# Patient Record
Sex: Female | Born: 1960 | Race: Black or African American | Hispanic: No | Marital: Single | Smoking: Current every day smoker
Health system: Southern US, Community
[De-identification: ages and names within clinical notes are randomized; demographics above are authoritative.]

## PROBLEM LIST (undated history)

## (undated) DIAGNOSIS — Z972 Presence of dental prosthetic device (complete) (partial): Secondary | ICD-10-CM

## (undated) DIAGNOSIS — I10 Essential (primary) hypertension: Secondary | ICD-10-CM

## (undated) DIAGNOSIS — T8859XA Other complications of anesthesia, initial encounter: Secondary | ICD-10-CM

## (undated) HISTORY — PX: ABDOMINAL HYSTERECTOMY: SHX81

## (undated) HISTORY — PX: OTHER SURGICAL HISTORY: SHX169

## (undated) HISTORY — PX: BREAST CYST ASPIRATION: SHX578

## (undated) HISTORY — DX: Essential (primary) hypertension: I10

---

## 2004-10-04 ENCOUNTER — Ambulatory Visit: Payer: Self-pay

## 2006-06-25 ENCOUNTER — Ambulatory Visit: Payer: Self-pay

## 2007-05-15 ENCOUNTER — Other Ambulatory Visit: Payer: Self-pay

## 2007-05-16 ENCOUNTER — Inpatient Hospital Stay: Payer: Self-pay | Admitting: Internal Medicine

## 2008-05-12 ENCOUNTER — Ambulatory Visit: Payer: Self-pay | Admitting: Internal Medicine

## 2008-05-12 ENCOUNTER — Ambulatory Visit: Payer: Self-pay | Admitting: Family Medicine

## 2009-03-07 ENCOUNTER — Ambulatory Visit: Payer: Self-pay | Admitting: Family Medicine

## 2011-12-03 ENCOUNTER — Emergency Department: Payer: Self-pay | Admitting: *Deleted

## 2011-12-11 ENCOUNTER — Emergency Department: Payer: Self-pay | Admitting: Internal Medicine

## 2013-10-11 ENCOUNTER — Emergency Department: Payer: Self-pay | Admitting: Emergency Medicine

## 2018-02-27 ENCOUNTER — Encounter: Payer: Self-pay | Admitting: Family Medicine

## 2018-02-27 ENCOUNTER — Ambulatory Visit: Payer: BLUE CROSS/BLUE SHIELD | Admitting: Family Medicine

## 2018-02-27 VITALS — BP 142/82 | HR 60 | Ht 65.0 in | Wt 135.0 lb

## 2018-02-27 DIAGNOSIS — Z7689 Persons encountering health services in other specified circumstances: Secondary | ICD-10-CM

## 2018-02-27 DIAGNOSIS — F17200 Nicotine dependence, unspecified, uncomplicated: Secondary | ICD-10-CM | POA: Diagnosis not present

## 2018-02-27 DIAGNOSIS — Z23 Encounter for immunization: Secondary | ICD-10-CM | POA: Diagnosis not present

## 2018-02-27 DIAGNOSIS — R002 Palpitations: Secondary | ICD-10-CM

## 2018-02-27 DIAGNOSIS — I10 Essential (primary) hypertension: Secondary | ICD-10-CM | POA: Diagnosis not present

## 2018-02-27 MED ORDER — LISINOPRIL 10 MG PO TABS: 10.0000 mg | ORAL_TABLET | Freq: Every day | ORAL | 2 refills | Status: DC

## 2018-02-27 MED ORDER — METOPROLOL SUCCINATE ER 25 MG PO TB24: 25.0000 mg | ORAL_TABLET | Freq: Every day | ORAL | 2 refills | Status: DC

## 2018-02-27 MED ORDER — LISINOPRIL 20 MG PO TABS: 20.0000 mg | ORAL_TABLET | Freq: Every day | ORAL | 1 refills | Status: DC

## 2018-02-27 NOTE — Progress Notes (Signed)
Date:  02/27/2018   Name:  Jamie Cervantes   DOB:  1960/10/14   MRN:  [id number]   Chief Complaint: Establish Care (PHQ=4); Hypertension (been back on med x 1 year- needs pcp to continue med); and Flu Vaccine Patient is a 57 year old female who presents for a comprehensive physical exam. The patient reports the following problems polyuria. Health maintenance has been reviewed needs everything.  Hypertension  This is a chronic problem. The current episode started more than 1 year ago. The problem is unchanged. The problem is controlled. Associated symptoms include palpitations. Pertinent negatives include no anxiety, blurred vision, chest pain, headaches, malaise/fatigue, neck pain, orthopnea, peripheral edema, PND, shortness of breath or sweats. There are no associated agents to hypertension. There are no known risk factors for coronary artery disease. Past treatments include ACE inhibitors. The current treatment provides moderate improvement. Compliance problems include medication cost.  There is no history of angina, kidney disease, CAD/MI, CVA, heart failure, left ventricular hypertrophy, PVD or retinopathy. There is no history of chronic renal disease, a hypertension causing med or renovascular disease.  Palpitations   This is a new problem. The current episode started 1 to 4 weeks ago (about a month). The problem occurs daily (usually at night). The symptoms are aggravated by caffeine (smoking). Associated symptoms include numbness. Pertinent negatives include no anxiety, chest pain, coughing, dizziness, fever, malaise/fatigue, nausea, shortness of breath, vomiting or weakness. The treatment provided moderate relief.     Review of Systems  Constitutional: Negative.  Negative for chills, fatigue, fever, malaise/fatigue and unexpected weight change.  HENT: Negative for congestion, ear discharge, ear pain, rhinorrhea, sinus pressure, sneezing and sore throat.   Eyes: Negative for blurred  vision, photophobia, pain, discharge, redness and itching.  Respiratory: Negative for cough, shortness of breath, wheezing and stridor.   Cardiovascular: Positive for palpitations. Negative for chest pain, orthopnea, leg swelling and PND.  Gastrointestinal: Negative for abdominal pain, blood in stool, constipation, diarrhea, nausea and vomiting.  Endocrine: Negative for cold intolerance, heat intolerance, polydipsia, polyphagia and polyuria.  Genitourinary: Negative for dysuria, flank pain, frequency, hematuria, menstrual problem, pelvic pain, urgency, vaginal bleeding and vaginal discharge.  Musculoskeletal: Negative for arthralgias, back pain, myalgias and neck pain.  Skin: Negative for rash.  Allergic/Immunologic: Negative for environmental allergies and food allergies.  Neurological: Positive for numbness. Negative for dizziness, weakness, light-headedness and headaches.  Hematological: Negative for adenopathy. Does not bruise/bleed easily.  Psychiatric/Behavioral: Negative for dysphoric mood. The patient is not nervous/anxious.     There are no active problems to display for this patient.   Allergies  Allergen Reactions  . Penicillins Nausea Only    Past Surgical History:  Procedure Laterality Date  . ABDOMINAL HYSTERECTOMY     partial  . toe spurs      Social History   Tobacco Use  . Smoking status: Current Every Day Smoker    Types: Cigarettes  . Smokeless tobacco: Never Used  . Tobacco comment: discussed smoking cessation- pt refuses/ has own plan  Substance Use Topics  . Alcohol use: Yes    Comment: occasionally  . Drug use: Never     Medication list has been reviewed and updated.  Current Meds  Medication Sig  . lisinopril (PRINIVIL,ZESTRIL) 10 MG tablet Take 1 tablet by mouth daily.    PHQ 2/9 Scores 02/27/2018  PHQ - 2 Score 1  PHQ- 9 Score 4    Physical Exam  Constitutional: She is oriented to  person, place, and time. She appears well-developed and  well-nourished.  HENT:  Head: Normocephalic.  Right Ear: External ear normal.  Left Ear: External ear normal.  Mouth/Throat: Oropharynx is clear and moist.  Eyes: Pupils are equal, round, and reactive to light. Conjunctivae and EOM are normal. Lids are everted and swept, no foreign bodies found. Left eye exhibits no hordeolum. No foreign body present in the left eye. Right conjunctiva is not injected. Left conjunctiva is not injected. No scleral icterus.  Neck: Normal range of motion. Neck supple. No JVD present. No tracheal deviation present. No thyromegaly present.  Cardiovascular: Normal rate, regular rhythm, normal heart sounds and intact distal pulses. Exam reveals no gallop and no friction rub.  No murmur heard. Pulmonary/Chest: Effort normal and breath sounds normal. No respiratory distress. She has no wheezes. She has no rales.  Abdominal: Soft. Bowel sounds are normal. She exhibits no mass. There is no hepatosplenomegaly. There is no tenderness. There is no rebound and no guarding.  Musculoskeletal: Normal range of motion. She exhibits no edema or tenderness.  Lymphadenopathy:    She has no cervical adenopathy.  Neurological: She is alert and oriented to person, place, and time. She has normal strength. She displays normal reflexes. No cranial nerve deficit.  Skin: Skin is warm. No rash noted.  Psychiatric: She has a normal mood and affect. Her mood appears not anxious. She does not exhibit a depressed mood.  Nursing note and vitals reviewed.   BP (!) 142/82   Pulse 60   Ht 5\' 5"  (1.651 m)   Wt 135 lb (61.2 kg)   BMI 22.47 kg/m   1. Establishing care with new doctor, encounter for Patient establishes care with new physician.  Will obtain baseline renal function panel. - Renal Function Panel  2. Essential hypertension Chronic.  Uncontrolled.  Will increase lisinopril to 20 mg.  We will recheck in 6 weeks. - lisinopril (PRINIVIL,ZESTRIL) 20 MG tablet; Take 1 tablet (20 mg  total) by mouth daily.  Dispense: 30 tablet; Refill: 1  3. Tobacco dependence Patient has been advised of the health risks of smoking and counseled concerning cessation of tobacco products. I spent over 3 minutes for discussion and to answer questions.  4. Influenza vaccine needed Discussed and administered - Flu Vaccine QUAD 36+ mos IM  5. Palpitations Patient relates episodes of palpitations over the past month without chest discomfort.  Patient relates previous history of an EKG that may have shown a previous MI.  Will obtain EKG with reading below - EKG 12-Lead - lisinopril (PRINIVIL,ZESTRIL) 20 MG tablet; Take 1 tablet (20 mg total) by mouth daily.  Dispense: 30 tablet; Refill: 1 I have reviewed EKG which shows ST and T wave abnormalities consider anterior lateral ischemia patient has sinus bradycardia 50 we will try to retrieve previous EKG to compare.. Comparison to previous EKG dated 20 years ago not available.  Dr. [facility]  02/27/2018

## 2018-02-28 LAB — RENAL FUNCTION PANEL
Albumin: 4.9 g/dL (ref 3.5–5.5)
BUN/Creatinine Ratio: 15 (ref 9–23)
BUN: 16 mg/dL (ref 6–24)
CO2: 27 mmol/L (ref 20–29)
Calcium: 10.3 mg/dL — ABNORMAL HIGH (ref 8.7–10.2)
Chloride: 102 mmol/L (ref 96–106)
Creatinine, Ser: 1.04 mg/dL — ABNORMAL HIGH (ref 0.57–1.00)
GFR calc Af Amer: 68 mL/min/1.73
GFR calc non Af Amer: 59 mL/min/1.73 — ABNORMAL LOW
Glucose: 87 mg/dL (ref 65–99)
Phosphorus: 3.3 mg/dL (ref 2.5–4.5)
Potassium: 4.3 mmol/L (ref 3.5–5.2)
Sodium: 142 mmol/L (ref 134–144)

## 2018-03-02 NOTE — Addendum Note (Signed)
Addended by: Fredderick Severance on: 03/02/2018 07:50 AM   Modules accepted: Orders

## 2018-03-03 DIAGNOSIS — E782 Mixed hyperlipidemia: Secondary | ICD-10-CM | POA: Diagnosis not present

## 2018-03-03 DIAGNOSIS — I10 Essential (primary) hypertension: Secondary | ICD-10-CM | POA: Diagnosis not present

## 2018-03-03 DIAGNOSIS — R9431 Abnormal electrocardiogram [ECG] [EKG]: Secondary | ICD-10-CM | POA: Diagnosis not present

## 2018-03-03 DIAGNOSIS — R002 Palpitations: Secondary | ICD-10-CM | POA: Diagnosis not present

## 2018-03-13 ENCOUNTER — Encounter: Payer: Self-pay | Admitting: Family Medicine

## 2018-03-13 ENCOUNTER — Ambulatory Visit: Payer: BLUE CROSS/BLUE SHIELD | Admitting: Family Medicine

## 2018-03-13 VITALS — BP 110/64 | HR 72 | Ht 65.0 in | Wt 138.0 lb

## 2018-03-13 DIAGNOSIS — I10 Essential (primary) hypertension: Secondary | ICD-10-CM | POA: Diagnosis not present

## 2018-03-13 DIAGNOSIS — R002 Palpitations: Secondary | ICD-10-CM

## 2018-03-13 MED ORDER — LISINOPRIL 20 MG PO TABS: 20.0000 mg | ORAL_TABLET | Freq: Every day | ORAL | 1 refills | Status: DC

## 2018-03-13 NOTE — Progress Notes (Signed)
Date:  03/13/2018   Name:  Jamie Cervantes   DOB:  Sep 25, 1960   MRN:  [id number]   Chief Complaint: Follow-up (saw Dr Nehemiah Massed- plans to have stress test and carotid doppler and AAA scan) Hypertension  This is a chronic problem. The current episode started more than 1 year ago. The problem has been gradually improving since onset. The problem is controlled. Pertinent negatives include no anxiety, blurred vision, chest pain, headaches, malaise/fatigue, neck pain, orthopnea, palpitations, peripheral edema, PND, shortness of breath or sweats. There are no associated agents to hypertension. There are no known risk factors for coronary artery disease. Past treatments include ACE inhibitors. The current treatment provides moderate improvement. There is no history of angina, kidney disease, CAD/MI, CVA, heart failure, left ventricular hypertrophy, PVD or retinopathy. There is no history of chronic renal disease, a hypertension causing med or renovascular disease.     Review of Systems  Constitutional: Negative.  Negative for chills, fatigue, fever, malaise/fatigue and unexpected weight change.  HENT: Negative for congestion, ear discharge, ear pain, rhinorrhea, sinus pressure, sneezing and sore throat.   Eyes: Negative for blurred vision, photophobia, pain, discharge, redness and itching.  Respiratory: Negative for cough, shortness of breath, wheezing and stridor.   Cardiovascular: Negative for chest pain, palpitations, orthopnea and PND.  Gastrointestinal: Negative for abdominal pain, blood in stool, constipation, diarrhea, nausea and vomiting.  Endocrine: Negative for cold intolerance, heat intolerance, polydipsia, polyphagia and polyuria.  Genitourinary: Negative for dysuria, flank pain, frequency, hematuria, menstrual problem, pelvic pain, urgency, vaginal bleeding and vaginal discharge.  Musculoskeletal: Negative for arthralgias, back pain, myalgias and neck pain.  Skin: Negative for rash.    Allergic/Immunologic: Negative for environmental allergies and food allergies.  Neurological: Negative for dizziness, weakness, light-headedness, numbness and headaches.  Hematological: Negative for adenopathy. Does not bruise/bleed easily.  Psychiatric/Behavioral: Negative for dysphoric mood. The patient is not nervous/anxious.     There are no active problems to display for this patient.   Allergies  Allergen Reactions  . Penicillins Nausea Only    Past Surgical History:  Procedure Laterality Date  . ABDOMINAL HYSTERECTOMY     partial  . toe spurs      Social History   Tobacco Use  . Smoking status: Current Every Day Smoker    Types: Cigarettes  . Smokeless tobacco: Never Used  . Tobacco comment: discussed smoking cessation- pt refuses/ has own plan  Substance Use Topics  . Alcohol use: Yes    Comment: occasionally  . Drug use: Never     Medication list has been reviewed and updated.  Current Meds  Medication Sig  . lisinopril (PRINIVIL,ZESTRIL) 20 MG tablet Take 1 tablet (20 mg total) by mouth daily.  . [DISCONTINUED] lisinopril (PRINIVIL,ZESTRIL) 20 MG tablet Take 1 tablet (20 mg total) by mouth daily.    PHQ 2/9 Scores 02/27/2018  PHQ - 2 Score 1  PHQ- 9 Score 4    Physical Exam  Constitutional: She is oriented to person, place, and time. She appears well-developed and well-nourished.  HENT:  Head: Normocephalic.  Right Ear: External ear normal.  Left Ear: External ear normal.  Mouth/Throat: Oropharynx is clear and moist.  Eyes: Pupils are equal, round, and reactive to light. Conjunctivae and EOM are normal. Lids are everted and swept, no foreign bodies found. Left eye exhibits no hordeolum. No foreign body present in the left eye. Right conjunctiva is not injected. Left conjunctiva is not injected. No scleral icterus.  Neck:  Normal range of motion. Neck supple. No JVD present. No tracheal deviation present. No thyromegaly present.  Cardiovascular:  Normal rate, regular rhythm, normal heart sounds and intact distal pulses. Exam reveals no gallop and no friction rub.  No murmur heard. Pulmonary/Chest: Effort normal and breath sounds normal. No respiratory distress. She has no wheezes. She has no rales.  Abdominal: Soft. Bowel sounds are normal. She exhibits no mass. There is no hepatosplenomegaly. There is no tenderness. There is no rebound and no guarding.  Musculoskeletal: Normal range of motion. She exhibits no edema or tenderness.  Lymphadenopathy:    She has no cervical adenopathy.  Neurological: She is alert and oriented to person, place, and time. She has normal strength. She displays normal reflexes. No cranial nerve deficit.  Skin: Skin is warm. No rash noted.  Psychiatric: She has a normal mood and affect. Her mood appears not anxious. She does not exhibit a depressed mood.  Nursing note and vitals reviewed.   BP 110/64   Pulse 72   Ht 5\' 5"  (1.651 m)   Wt 138 lb (62.6 kg)   BMI 22.96 kg/m   Assessment and Plan:  1. Essential hypertension Stable on med- refill lisinopril - lisinopril (PRINIVIL,ZESTRIL) 20 MG tablet; Take 1 tablet (20 mg total) by mouth daily.  Dispense: 90 tablet; Refill: 1  2. Palpitations Proceed with stress test and carotid US as discussed with Dr Nehemiah Massed   Dr. Otilio Miu [facility]  03/13/2018

## 2018-03-18 ENCOUNTER — Other Ambulatory Visit (HOSPITAL_COMMUNITY)
Admission: RE | Admit: 2018-03-18 | Discharge: 2018-03-18 | Disposition: A | Discharge status: Home / Self Care | Payer: BLUE CROSS/BLUE SHIELD | Source: Ambulatory Visit | Attending: Family Medicine | Admitting: Family Medicine

## 2018-03-18 ENCOUNTER — Ambulatory Visit (INDEPENDENT_AMBULATORY_CARE_PROVIDER_SITE_OTHER): Payer: BLUE CROSS/BLUE SHIELD | Admitting: Family Medicine

## 2018-03-18 ENCOUNTER — Encounter: Payer: Self-pay | Admitting: Family Medicine

## 2018-03-18 VITALS — BP 138/78 | HR 70 | Resp 16 | Ht 65.0 in | Wt 137.8 lb

## 2018-03-18 DIAGNOSIS — Z01419 Encounter for gynecological examination (general) (routine) without abnormal findings: Secondary | ICD-10-CM | POA: Diagnosis not present

## 2018-03-18 DIAGNOSIS — Z1211 Encounter for screening for malignant neoplasm of colon: Secondary | ICD-10-CM

## 2018-03-18 DIAGNOSIS — Z1272 Encounter for screening for malignant neoplasm of vagina: Secondary | ICD-10-CM | POA: Diagnosis not present

## 2018-03-18 DIAGNOSIS — Z0000 Encounter for general adult medical examination without abnormal findings: Secondary | ICD-10-CM | POA: Diagnosis not present

## 2018-03-18 DIAGNOSIS — Z1231 Encounter for screening mammogram for malignant neoplasm of breast: Secondary | ICD-10-CM

## 2018-03-18 LAB — HEMOCCULT GUIAC POC 1CARD (OFFICE): Fecal Occult Blood, POC: NEGATIVE

## 2018-03-18 LAB — HM PAP SMEAR: HM Pap smear: NEGATIVE

## 2018-03-18 LAB — RESULTS CONSOLE HPV: CHL HPV: NEGATIVE

## 2018-03-18 NOTE — Progress Notes (Signed)
Date:  03/18/2018   Name:  Jamie Cervantes   DOB:  Sep 27, 1960   MRN:  [id number]   Chief Complaint: Annual Exam Patient is a 57 year old female who presents for a comprehensive physical exam. The patient reports the following problems: none. Health maintenance has been reviewed up to date.    Review of Systems  Constitutional: Negative.  Negative for chills, fatigue, fever and unexpected weight change.  HENT: Negative for congestion, ear discharge, ear pain, rhinorrhea, sinus pressure, sneezing and sore throat.   Eyes: Negative for photophobia, pain, discharge, redness and itching.  Respiratory: Negative for cough, shortness of breath, wheezing and stridor.   Gastrointestinal: Negative for abdominal pain, blood in stool, constipation, diarrhea, nausea and vomiting.  Endocrine: Negative for cold intolerance, heat intolerance, polydipsia, polyphagia and polyuria.  Genitourinary: Negative for dysuria, flank pain, frequency, hematuria, menstrual problem, pelvic pain, urgency, vaginal bleeding and vaginal discharge.  Musculoskeletal: Negative for arthralgias, back pain and myalgias.  Skin: Negative for rash.  Allergic/Immunologic: Negative for environmental allergies and food allergies.  Neurological: Negative for dizziness, weakness, light-headedness, numbness and headaches.  Hematological: Negative for adenopathy. Does not bruise/bleed easily.  Psychiatric/Behavioral: Negative for dysphoric mood. The patient is not nervous/anxious.     There are no active problems to display for this patient.   Allergies  Allergen Reactions  . Penicillins Nausea Only    Past Surgical History:  Procedure Laterality Date  . ABDOMINAL HYSTERECTOMY     partial  . toe spurs      Social History   Tobacco Use  . Smoking status: Current Every Day Smoker    Types: Cigarettes  . Smokeless tobacco: Never Used  . Tobacco comment: discussed smoking cessation- pt refuses/ has own plan  Substance Use  Topics  . Alcohol use: Yes    Comment: occasionally  . Drug use: Never     Medication list has been reviewed and updated.  Current Meds  Medication Sig  . lisinopril (PRINIVIL,ZESTRIL) 20 MG tablet Take 1 tablet (20 mg total) by mouth daily.    PHQ 2/9 Scores 02/27/2018  PHQ - 2 Score 1  PHQ- 9 Score 4    Physical Exam  Constitutional: She is oriented to person, place, and time. Vital signs are normal. She appears well-developed and well-nourished.  HENT:  Head: Normocephalic.  Right Ear: Hearing, tympanic membrane, external ear and ear canal normal.  Left Ear: Hearing, tympanic membrane, external ear and ear canal normal.  Nose: Nose normal.  Mouth/Throat: Uvula is midline and oropharynx is clear and moist. No oropharyngeal exudate, posterior oropharyngeal edema or posterior oropharyngeal erythema.  Eyes: Pupils are equal, round, and reactive to light. Conjunctivae, EOM and lids are normal. Lids are everted and swept, no foreign bodies found. Left eye exhibits no hordeolum. No foreign body present in the left eye. Right conjunctiva is not injected. Left conjunctiva is not injected. No scleral icterus.  Fundoscopic exam:      The right eye shows no arteriolar narrowing and no AV nicking.       The left eye shows no arteriolar narrowing and no AV nicking.  Neck: Trachea normal, normal range of motion and full passive range of motion without pain. Neck supple. Normal carotid pulses, no hepatojugular reflux and no JVD present. No tracheal tenderness present. Carotid bruit is not present. No neck rigidity. No tracheal deviation and no edema present. No thyroid mass and no thyromegaly present.  Cardiovascular: Normal rate, regular rhythm, S1  normal, S2 normal, normal heart sounds, intact distal pulses and normal pulses. PMI is not displaced. Exam reveals no gallop, no S3, no S4, no distant heart sounds and no friction rub.  No murmur heard.  No systolic murmur is present.  No diastolic  murmur is present. Pulses:      Carotid pulses are 2+ on the right side, and 2+ on the left side.      Radial pulses are 2+ on the right side, and 2+ on the left side.       Femoral pulses are 2+ on the right side, and 2+ on the left side.      Popliteal pulses are 2+ on the right side, and 2+ on the left side.       Dorsalis pedis pulses are 2+ on the right side, and 2+ on the left side.       Posterior tibial pulses are 2+ on the right side, and 2+ on the left side.  Pulmonary/Chest: Effort normal and breath sounds normal. No stridor. No respiratory distress. She has no decreased breath sounds. She has no wheezes. She has no rales. She exhibits no mass. Right breast exhibits no inverted nipple, no mass, no nipple discharge, no skin change and no tenderness. Left breast exhibits no inverted nipple, no mass, no nipple discharge, no skin change and no tenderness. No breast swelling, tenderness or discharge.  Skin tag noted    Abdominal: Soft. Normal appearance and bowel sounds are normal. She exhibits no mass. There is no hepatosplenomegaly. There is no tenderness. There is no rigidity, no rebound, no guarding and no CVA tenderness.  Genitourinary: Rectum normal and vagina normal. Rectal exam shows no external hemorrhoid, no internal hemorrhoid, no fissure, no mass and guaiac negative stool. No breast swelling, tenderness or discharge. Pelvic exam was performed with patient supine. There is no rash, tenderness or lesion on the right labia. There is no rash, tenderness or lesion on the left labia. Right adnexum displays no mass, no tenderness and no fullness. Left adnexum displays no mass, no tenderness and no fullness. No erythema in the vagina. No vaginal discharge found.  Genitourinary Comments: hysterectomy  Musculoskeletal: Normal range of motion. She exhibits no edema or tenderness.       Cervical back: Normal.       Thoracic back: Normal.       Lumbar back: Normal.  Lymphadenopathy:        Head (right side): No submandibular adenopathy present.       Head (left side): No submandibular adenopathy present.    She has no cervical adenopathy.       Right cervical: No superficial cervical adenopathy present.      Left cervical: No superficial cervical adenopathy present.    She has no axillary adenopathy.  Neurological: She is alert and oriented to person, place, and time. She has normal strength. She displays normal reflexes. No cranial nerve deficit or sensory deficit.  Skin: Skin is warm, dry and intact. Capillary refill takes less than 2 seconds. No rash noted. No pallor.  Psychiatric: She has a normal mood and affect. Her mood appears not anxious. She does not exhibit a depressed mood.  Nursing note and vitals reviewed.   BP 138/78   Pulse 70   Resp 16   Ht 5\' 5"  (1.651 m)   Wt 137 lb 12.8 oz (62.5 kg)   SpO2 99%   BMI 22.93 kg/m   Assessment and Plan: 1. Annual physical  exam No subjective objective concerns noted on physical exam. - Cytology - PAP ROBBYE DEDE is a 57 y.o. female who presents today for her Complete Annual Exam. She feels well. She reports exercising . She reports she is sleeping well. Immunizations are reviewed and recommendations provided.   Age appropriate screening tests are discussed. Counseling given for risk factor reduction interventions. 2. Periodic health assessment, Pap and pelvic Pap and pelvic performed and no subjective or objective concerns noted  3. Visit for screening mammogram Discussed breast cancer screening and screening mammogram schedule. - MM 3D SCREEN BREAST BILATERAL; Future  4. Screening for vaginal cancer No abnormal discharge noted on exam Of vaginal cuff. - Cytology - PAP  5. Colon cancer screening Discussed and referral for colonoscopy needed. - POCT Occult Blood Stool There are no diagnoses linked to this encounter. mammo sched for Dec 4 @ 10:40 in [place]  Dr. [facility]  03/18/2018

## 2018-03-23 LAB — CYTOLOGY - PAP
ADEQUACY: ABSENT
DIAGNOSIS: NEGATIVE
HPV: NOT DETECTED

## 2018-03-24 ENCOUNTER — Other Ambulatory Visit: Payer: Self-pay

## 2018-03-24 DIAGNOSIS — A5901 Trichomonal vulvovaginitis: Secondary | ICD-10-CM

## 2018-03-24 DIAGNOSIS — Z8679 Personal history of other diseases of the circulatory system: Secondary | ICD-10-CM | POA: Diagnosis not present

## 2018-03-24 DIAGNOSIS — I700 Atherosclerosis of aorta: Secondary | ICD-10-CM | POA: Diagnosis not present

## 2018-03-24 DIAGNOSIS — R9431 Abnormal electrocardiogram [ECG] [EKG]: Secondary | ICD-10-CM | POA: Diagnosis not present

## 2018-03-24 MED ORDER — METRONIDAZOLE 500 MG PO TABS: 2000.0000 mg | ORAL_TABLET | Freq: Every day | ORAL | 0 refills | Status: DC

## 2018-03-25 ENCOUNTER — Encounter (INDEPENDENT_AMBULATORY_CARE_PROVIDER_SITE_OTHER): Payer: Self-pay

## 2018-03-25 ENCOUNTER — Ambulatory Visit
Admission: RE | Admit: 2018-03-25 | Discharge: 2018-03-25 | Disposition: A | Discharge status: Home / Self Care | Payer: BLUE CROSS/BLUE SHIELD | Source: Ambulatory Visit | Attending: Family Medicine | Admitting: Family Medicine

## 2018-03-25 DIAGNOSIS — Z1231 Encounter for screening mammogram for malignant neoplasm of breast: Secondary | ICD-10-CM | POA: Insufficient documentation

## 2018-04-08 DIAGNOSIS — E782 Mixed hyperlipidemia: Secondary | ICD-10-CM | POA: Diagnosis not present

## 2018-04-08 DIAGNOSIS — I10 Essential (primary) hypertension: Secondary | ICD-10-CM | POA: Diagnosis not present

## 2018-04-08 DIAGNOSIS — R002 Palpitations: Secondary | ICD-10-CM | POA: Diagnosis not present

## 2018-04-08 DIAGNOSIS — I700 Atherosclerosis of aorta: Secondary | ICD-10-CM | POA: Diagnosis not present

## 2018-05-20 ENCOUNTER — Encounter: Payer: Self-pay | Admitting: Family Medicine

## 2018-05-20 ENCOUNTER — Ambulatory Visit (INDEPENDENT_AMBULATORY_CARE_PROVIDER_SITE_OTHER): Payer: BLUE CROSS/BLUE SHIELD | Admitting: Family Medicine

## 2018-05-20 VITALS — BP 124/90 | HR 64 | Temp 98.3°F | Ht 65.0 in | Wt 138.0 lb

## 2018-05-20 DIAGNOSIS — J0100 Acute maxillary sinusitis, unspecified: Secondary | ICD-10-CM

## 2018-05-20 DIAGNOSIS — F17210 Nicotine dependence, cigarettes, uncomplicated: Secondary | ICD-10-CM | POA: Diagnosis not present

## 2018-05-20 MED ORDER — AZITHROMYCIN 250 MG PO TABS: ORAL_TABLET | ORAL | 0 refills | Status: DC

## 2018-05-20 NOTE — Progress Notes (Signed)
Date:  05/20/2018   Name:  Jamie Cervantes   DOB:  10-15-1960   MRN:  [id number]   Chief Complaint: Sinusitis (started 5 days ago with headache, chills, fever, body aches, hoarse- in the bed over the weekend. Has went to work, but feeling very bad. Throat drainage and congestion)  Sinusitis  This is a new problem. The current episode started in the past 7 days (thursday). The problem has been waxing and waning since onset. There has been no fever. The fever has been present for 3 to 4 days. The pain is moderate. Associated symptoms include headaches. Pertinent negatives include no chills, congestion, coughing, diaphoresis, ear pain, hoarse voice, neck pain, shortness of breath, sinus pressure, sneezing, sore throat or swollen glands.    Review of Systems  Constitutional: Negative.  Negative for chills, diaphoresis, fatigue, fever and unexpected weight change.  HENT: Negative for congestion, ear discharge, ear pain, hoarse voice, rhinorrhea, sinus pressure, sneezing and sore throat.   Eyes: Negative for photophobia, pain, discharge, redness and itching.  Respiratory: Negative for cough, shortness of breath, wheezing and stridor.   Gastrointestinal: Negative for abdominal pain, blood in stool, constipation, diarrhea, nausea and vomiting.  Endocrine: Negative for cold intolerance, heat intolerance, polydipsia, polyphagia and polyuria.  Genitourinary: Negative for dysuria, flank pain, frequency, hematuria, menstrual problem, pelvic pain, urgency, vaginal bleeding and vaginal discharge.  Musculoskeletal: Negative for arthralgias, back pain, myalgias and neck pain.  Skin: Negative for rash.  Allergic/Immunologic: Negative for environmental allergies and food allergies.  Neurological: Positive for headaches. Negative for dizziness, weakness, light-headedness and numbness.  Hematological: Negative for adenopathy. Does not bruise/bleed easily.  Psychiatric/Behavioral: Negative for dysphoric mood.  The patient is not nervous/anxious.     There are no active problems to display for this patient.   Allergies  Allergen Reactions  . Penicillins Nausea Only    Past Surgical History:  Procedure Laterality Date  . ABDOMINAL HYSTERECTOMY     partial  . BREAST CYST ASPIRATION Right    neg  . toe spurs      Social History   Tobacco Use  . Smoking status: Current Every Day Smoker    Types: Cigarettes  . Smokeless tobacco: Never Used  . Tobacco comment: discussed smoking cessation- pt refuses/ has own plan  Substance Use Topics  . Alcohol use: Yes    Comment: occasionally  . Drug use: Never     Medication list has been reviewed and updated.  Current Meds  Medication Sig  . lisinopril (PRINIVIL,ZESTRIL) 20 MG tablet Take 1 tablet (20 mg total) by mouth daily.    PHQ 2/9 Scores 02/27/2018  PHQ - 2 Score 1  PHQ- 9 Score 4    Physical Exam Vitals signs and nursing note reviewed.  Constitutional:      General: She is not in acute distress.    Appearance: She is not diaphoretic.  HENT:     Head: Normocephalic and atraumatic.     Right Ear: Tympanic membrane, ear canal and external ear normal.     Left Ear: Tympanic membrane, ear canal and external ear normal.     Nose: Nose normal. No congestion or rhinorrhea.  Eyes:     General:        Right eye: No discharge.        Left eye: No discharge.     Conjunctiva/sclera: Conjunctivae normal.     Pupils: Pupils are equal, round, and reactive to light.  Neck:  Musculoskeletal: Normal range of motion and neck supple.     Thyroid: No thyromegaly.     Vascular: No JVD.  Cardiovascular:     Rate and Rhythm: Normal rate and regular rhythm.     Heart sounds: Normal heart sounds. No murmur. No friction rub. No gallop.   Pulmonary:     Effort: Pulmonary effort is normal.     Breath sounds: Normal breath sounds. No wheezing, rhonchi or rales.  Chest:     Chest wall: No tenderness.  Abdominal:     General: Bowel sounds  are normal.     Palpations: Abdomen is soft. There is no mass.     Tenderness: There is no abdominal tenderness. There is no guarding.  Musculoskeletal: Normal range of motion.  Lymphadenopathy:     Cervical: No cervical adenopathy.  Skin:    General: Skin is warm and dry.  Neurological:     Mental Status: She is alert.     Deep Tendon Reflexes: Reflexes are normal and symmetric.     BP 124/90   Pulse 64   Temp 98.3 F (36.8 C) (Oral)   Ht 5\' 5"  (1.651 m)   Wt 138 lb (62.6 kg)   BMI 22.96 kg/m   Assessment and Plan:  1. Acute non-recurrent maxillary sinusitis Patient with symptoms possible influenza.  Influenza was noted to be negative for a and B.  Exam is consistent with tenderness over the maxillary sinus and will treat with a azithromycin 2 today and 1 a day for 4 days thereafter. - azithromycin (ZITHROMAX) 250 MG tablet; 2 today then 1 a day for 4 days  Dispense: 6 each; Refill: 0  2. Cigarette nicotine dependence without complication Patient has been advised of the health risks of smoking and counseled concerning cessation of tobacco products. I spent over 3 minutes for discussion and to answer questions.

## 2018-06-18 ENCOUNTER — Encounter: Payer: Self-pay | Admitting: Family Medicine

## 2018-06-18 ENCOUNTER — Ambulatory Visit: Payer: BLUE CROSS/BLUE SHIELD | Admitting: Family Medicine

## 2018-06-18 VITALS — BP 120/80 | HR 80 | Temp 98.5°F | Ht 65.0 in | Wt 141.0 lb

## 2018-06-18 DIAGNOSIS — R05 Cough: Secondary | ICD-10-CM | POA: Diagnosis not present

## 2018-06-18 DIAGNOSIS — J0100 Acute maxillary sinusitis, unspecified: Secondary | ICD-10-CM | POA: Diagnosis not present

## 2018-06-18 DIAGNOSIS — I10 Essential (primary) hypertension: Secondary | ICD-10-CM

## 2018-06-18 DIAGNOSIS — T464X5A Adverse effect of angiotensin-converting-enzyme inhibitors, initial encounter: Secondary | ICD-10-CM

## 2018-06-18 DIAGNOSIS — R058 Other specified cough: Secondary | ICD-10-CM

## 2018-06-18 MED ORDER — AZITHROMYCIN 250 MG PO TABS: ORAL_TABLET | ORAL | 0 refills | Status: DC

## 2018-06-18 MED ORDER — LOSARTAN POTASSIUM 50 MG PO TABS: 50.0000 mg | ORAL_TABLET | Freq: Every day | ORAL | 1 refills | Status: DC

## 2018-06-18 NOTE — Progress Notes (Signed)
Date:  06/18/2018   Name:  Jamie Cervantes   DOB:  Sep 23, 1960   MRN:  [id number]   Chief Complaint: Sinusitis (head pressure, nausea, fatigue x 2 days)  Sinusitis  This is a new problem. The current episode started in the past 7 days. The problem has been gradually worsening since onset. There has been no fever. The pain is mild. Associated symptoms include congestion and sinus pressure. Pertinent negatives include no chills, coughing, diaphoresis, ear pain, headaches, hoarse voice, neck pain, shortness of breath, sneezing, sore throat or swollen glands. Past treatments include acetaminophen. The treatment provided mild relief.    Review of Systems  Constitutional: Negative.  Negative for chills, diaphoresis, fatigue, fever and unexpected weight change.  HENT: Positive for congestion and sinus pressure. Negative for ear discharge, ear pain, hoarse voice, rhinorrhea, sneezing and sore throat.   Eyes: Negative for photophobia, pain, discharge, redness and itching.  Respiratory: Negative for cough, shortness of breath, wheezing and stridor.   Cardiovascular: Negative for leg swelling.  Gastrointestinal: Negative for abdominal pain, blood in stool, constipation, diarrhea, nausea and vomiting.  Endocrine: Negative for cold intolerance, heat intolerance, polydipsia, polyphagia and polyuria.  Genitourinary: Negative for dysuria, flank pain, frequency, hematuria, menstrual problem, pelvic pain, urgency, vaginal bleeding and vaginal discharge.  Musculoskeletal: Negative for arthralgias, back pain, myalgias and neck pain.  Skin: Negative for rash.  Allergic/Immunologic: Negative for environmental allergies and food allergies.  Neurological: Negative for dizziness, weakness, light-headedness, numbness and headaches.  Hematological: Negative for adenopathy. Does not bruise/bleed easily.  Psychiatric/Behavioral: Negative for dysphoric mood. The patient is not nervous/anxious.     There are no active  problems to display for this patient.   Allergies  Allergen Reactions  . Penicillins Nausea Only    Past Surgical History:  Procedure Laterality Date  . ABDOMINAL HYSTERECTOMY     partial  . BREAST CYST ASPIRATION Right    neg  . toe spurs      Social History   Tobacco Use  . Smoking status: Current Every Day Smoker    Types: Cigarettes  . Smokeless tobacco: Never Used  . Tobacco comment: discussed smoking cessation- pt refuses/ has own plan  Substance Use Topics  . Alcohol use: Yes    Comment: occasionally  . Drug use: Never     Medication list has been reviewed and updated.  Current Meds  Medication Sig  . lisinopril (PRINIVIL,ZESTRIL) 20 MG tablet Take 1 tablet (20 mg total) by mouth daily.    PHQ 2/9 Scores 02/27/2018  PHQ - 2 Score 1  PHQ- 9 Score 4    Physical Exam Vitals signs and nursing note reviewed.  Constitutional:      General: She is not in acute distress.    Appearance: She is not diaphoretic.  HENT:     Head: Normocephalic and atraumatic.     Right Ear: External ear normal.     Left Ear: External ear normal.     Nose: Nose normal.  Eyes:     General:        Right eye: No discharge.        Left eye: No discharge.     Conjunctiva/sclera: Conjunctivae normal.     Pupils: Pupils are equal, round, and reactive to light.  Neck:     Musculoskeletal: Normal range of motion and neck supple.     Thyroid: No thyromegaly.     Vascular: No JVD.  Cardiovascular:     Rate  and Rhythm: Normal rate and regular rhythm.     Heart sounds: Normal heart sounds. No murmur. No friction rub. No gallop.   Pulmonary:     Effort: Pulmonary effort is normal.     Breath sounds: Normal breath sounds.  Abdominal:     General: Bowel sounds are normal.     Palpations: Abdomen is soft. There is no mass.     Tenderness: There is no abdominal tenderness. There is no guarding.  Musculoskeletal: Normal range of motion.  Lymphadenopathy:     Cervical: No cervical  adenopathy.  Skin:    General: Skin is warm and dry.  Neurological:     Mental Status: She is alert.     Deep Tendon Reflexes: Reflexes are normal and symmetric.     BP 120/80   Pulse 80   Temp 98.5 F (36.9 C) (Oral)   Ht 5\' 5"  (1.651 m)   Wt 141 lb (64 kg)   SpO2 99%   BMI 23.46 kg/m   Assessment and Plan: 1. Acute maxillary sinusitis, recurrence not specified Acute.  Persistent.  Exam is consistent with a sinus infection with tenderness over the maxillary sinuses.  Will initiate azithromycin 250 mg 2 today followed by 1 a day for 4 days. - azithromycin (ZITHROMAX) 250 MG tablet; 2 today then 1 a day for 4 days  Dispense: 6 tablet; Refill: 0  2. Cough due to ACE inhibitor And has had a persistent cough and it was noted that she is on 20 mg lisinopril switch over to Cozaar at the 50 mg dosing and will recheck in 6 weeks. - losartan (COZAAR) 50 MG tablet; Take 1 tablet (50 mg total) by mouth daily.  Dispense: 30 tablet; Refill: 1  3. Essential hypertension Has hypertension which is controlled on lisinopril however patient has a secondary cough due to the ACE inhibitor and we will switch to the ACE receptor blocker Cozaar. - losartan (COZAAR) 50 MG tablet; Take 1 tablet (50 mg total) by mouth daily.  Dispense: 30 tablet; Refill: 1

## 2018-07-30 ENCOUNTER — Ambulatory Visit: Payer: BLUE CROSS/BLUE SHIELD | Admitting: Family Medicine

## 2018-10-01 DIAGNOSIS — I700 Atherosclerosis of aorta: Secondary | ICD-10-CM | POA: Diagnosis not present

## 2018-10-01 DIAGNOSIS — E782 Mixed hyperlipidemia: Secondary | ICD-10-CM | POA: Diagnosis not present

## 2018-10-01 DIAGNOSIS — I10 Essential (primary) hypertension: Secondary | ICD-10-CM | POA: Diagnosis not present

## 2019-01-09 ENCOUNTER — Ambulatory Visit
Admission: EM | Admit: 2019-01-09 | Discharge: 2019-01-09 | Disposition: A | Discharge status: Home / Self Care | Payer: BC Managed Care – PPO

## 2019-01-09 ENCOUNTER — Other Ambulatory Visit: Payer: Self-pay

## 2019-01-09 DIAGNOSIS — J3489 Other specified disorders of nose and nasal sinuses: Secondary | ICD-10-CM | POA: Diagnosis not present

## 2019-01-09 MED ORDER — METHYLPREDNISOLONE 4 MG PO TBPK: ORAL_TABLET | ORAL | 0 refills | Status: DC

## 2019-01-09 NOTE — ED Triage Notes (Signed)
Facial pressure behind eyes more on Right side.

## 2019-01-09 NOTE — Discharge Instructions (Addendum)
I am placing you on prednisone to help with sinus inflammation. I dont detect an infection. Please watch out for a rash around the area of pain, since as intense as your pain was yesterday could be you may end up having shingles, but right now I do not detect this. If your pain gets bad again, and you have vision changes, please go to the ER.

## 2019-01-09 NOTE — ED Provider Notes (Addendum)
[facility]    CSN: [id number] Arrival date & time: 01/09/19  0825      History   Chief Complaint Chief Complaint  Patient presents with  . Sinusitis    HPI Jamie Cervantes is a 58 y.o. female. with sinus HA x 1 week. Then yesterday felt increased pressure on R forehead and R face described as intense throbbing pain. The pain has kept her up. She tends to get sinus pressure with weather changes. Denies nose congestion or post nasal drainage. 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 Her last eye exam was last month and got new glasses. The pain yesterday was 8/10, since she got up from a short nap this am, the pain is very mild. Felt a little light headed this am when she woke up from her nap, but is fine now. She does not think she could make it tonight at work and needs a work note.     Past Medical History:  Diagnosis Date  . Hypertension     There are no active problems to display for this patient.   Past Surgical History:  Procedure Laterality Date  . ABDOMINAL HYSTERECTOMY     partial  . BREAST CYST ASPIRATION Right    neg  . toe spurs      OB History   No obstetric history on file.      Home Medications    Prior to Admission medications   Medication Sig Start Date End Date Taking? Authorizing Provider  losartan (COZAAR) 50 MG tablet Take 1 tablet (50 mg total) by mouth daily. 06/18/18  Yes Juline Patch, MD  azithromycin (ZITHROMAX) 250 MG tablet 2 today then 1 a day for 4 days 06/18/18   Juline Patch, MD  lisinopril (ZESTRIL) 20 MG tablet Take by mouth. 02/27/18   [provider]    Family History Family History  Problem Relation Age of Onset  . Heart disease Mother   . Hypertension Mother   . Breast cancer Neg Hx     Social History Social History   Tobacco Use  . Smoking status: Current Every Day Smoker    Types: Cigarettes  . Smokeless tobacco: Never Used  .  Tobacco comment: discussed smoking cessation- pt refuses/ has own plan  Substance Use Topics  . Alcohol use: Yes    Comment: occasionally  . Drug use: Never     Allergies   Penicillins   Review of Systems Review of Systems  Constitutional: Negative for appetite change, chills and fever.  HENT: Positive for postnasal drip, sinus pressure, sinus pain and sneezing. Negative for congestion, dental problem, ear discharge, ear pain, facial swelling, rhinorrhea, sore throat, trouble swallowing and voice change.        She had one episode of sneezing when she arrived here  Eyes: Positive for visual disturbance.       With R eye last night during her shift, and thinks due to the pain.   Respiratory: Negative for cough and shortness of breath.   Cardiovascular: Negative for chest pain.  Musculoskeletal: Negative for gait problem.  Skin: Negative for rash.  Neurological: Positive for dizziness. Negative for tremors, facial asymmetry, speech difficulty, weakness, light-headedness and numbness.  Hematological: Negative for adenopathy.     Physical Exam Triage Vital Signs ED Triage Vitals  Enc Vitals Group     BP 01/09/19 0838 (!) 141/78  Pulse Rate 01/09/19 0838 (!) 58     Resp 01/09/19 0838 16     Temp 01/09/19 0838 98 F (36.7 C)     Temp Source 01/09/19 0838 Oral     SpO2 01/09/19 0838 98 %     Weight 01/09/19 0836 150 lb (68 kg)     Height 01/09/19 0836 5\' 6"  (1.676 m)     Head Circumference --      Peak Flow --      Pain Score 01/09/19 0835 8     Pain Loc --      Pain Edu? --      Excl. in [place]? --    No data found.  Updated Vital Signs BP (!) 141/78 (BP Location: Left Arm)   Pulse (!) 58   Temp 98 F (36.7 C) (Oral)   Resp 16   Ht 5\' 6"  (1.676 m)   Wt 150 lb (68 kg)   SpO2 98%   BMI 24.21 kg/m   Visual Acuity Right Eye Distance:   Left Eye Distance:   Bilateral Distance:    Right Eye Near:   Left Eye Near:    Bilateral Near:     Physical Exam Vitals  signs and nursing note reviewed.  Constitutional:      General: She is not in acute distress.    Appearance: Normal appearance. She is not toxic-appearing.  HENT:     Right Ear: Tympanic membrane, ear canal and external ear normal.     Left Ear: Tympanic membrane, ear canal and external ear normal.     Nose: Nose normal.     Comments: Has mild tenderness of R ethmoid and R maxillary sinus, but more so than on R maxillary sinus Eyes:     General: No scleral icterus.    Extraocular Movements: Extraocular movements intact.     Conjunctiva/sclera: Conjunctivae normal.     Pupils: Pupils are equal, round, and reactive to light.  Neck:     Musculoskeletal: Normal range of motion.     Vascular: No carotid bruit.  Cardiovascular:     Rate and Rhythm: Normal rate and regular rhythm.     Heart sounds: No murmur.  Pulmonary:     Effort: Pulmonary effort is normal.     Breath sounds: Normal breath sounds.  Musculoskeletal: Normal range of motion.  Lymphadenopathy:     Cervical: No cervical adenopathy.  Skin:    General: Skin is warm and dry.     Findings: No rash.  Neurological:     Mental Status: She is alert and oriented to person, place, and time.     Cranial Nerves: No cranial nerve deficit.     Sensory: No sensory deficit.     Motor: No weakness.     Coordination: Coordination normal.     Gait: Gait normal.     Comments: No face asymmetry. Normal Romberg.   Psychiatric:        Mood and Affect: Mood normal.        Behavior: Behavior normal.        Thought Content: Thought content normal.        Judgment: Judgment normal.      UC Treatments / Results  Labs (all labs ordered are listed, but only abnormal results are displayed) Labs Reviewed - No data to display  EKG   Radiology No results found.  Procedures   Medications Ordered in UC Medications - No data to display  Initial Impression /  Assessment and Plan / UC Course  I have reviewed the triage vital signs and  the nursing notes. I placed her on Medrol for sinus inflammation and pain. See pt instructions.   Final Clinical Impressions(s) / UC Diagnoses   Final diagnoses:  None   Discharge Instructions   None    ED Prescriptions    None     PDMP not reviewed this encounter.   Shelby Mattocks, PA-C 01/09/19 0930    Rodriguez-[place], [place], PA-C 01/09/19 [phone]

## 2019-03-28 ENCOUNTER — Ambulatory Visit
Admission: EM | Admit: 2019-03-28 | Discharge: 2019-03-28 | Disposition: A | Discharge status: Home / Self Care | Payer: BC Managed Care – PPO | Attending: Emergency Medicine | Admitting: Emergency Medicine

## 2019-03-28 ENCOUNTER — Other Ambulatory Visit: Payer: Self-pay

## 2019-03-28 DIAGNOSIS — Z7189 Other specified counseling: Secondary | ICD-10-CM

## 2019-03-28 DIAGNOSIS — J069 Acute upper respiratory infection, unspecified: Secondary | ICD-10-CM

## 2019-03-28 DIAGNOSIS — Z20828 Contact with and (suspected) exposure to other viral communicable diseases: Secondary | ICD-10-CM

## 2019-03-28 DIAGNOSIS — Z20822 Contact with and (suspected) exposure to covid-19: Secondary | ICD-10-CM

## 2019-03-28 NOTE — ED Provider Notes (Signed)
[place], [place]   Name: Jamie Cervantes DOB: Aug 28, 1960 MRN: [id number] CSN: [id number] PCP: Juline Patch, MD  Arrival date and time:  03/28/19 1433  Chief Complaint:  Nasal Congestion   NOTE: Prior to seeing the patient today, I have reviewed the triage nursing documentation and vital signs. Clinical staff has updated patient's PMH/PSHx, current medication list, and drug allergies/intolerances to ensure comprehensive history available to assist in medical decision making.   History:   HPI: Jamie Cervantes is a 58 y.o. female who presents today with complaints of congestion, sore throat, and a generalized headache that began approximately 3 days ago. Patient denies fevers. She reports that she has been nauseated and feeling "queasy". She denies any episodes of vomiting, diarrhea, or pain in her abdomen. She denies any facial pain. PMH (+) seasonal allergies; does not take any kind of medication for allergies. She is eating and drinking well. Patient presents with concerns related to possible SARS-CoV-2 (novel coronavirus) infection after her sense of taste and smell acutely decreased yesterday. She notes that she is still able to taste and smell "some". Patient denies being in close contact with anyone known to be ill; no one else is her home has experienced a similar constellation. She has never been tested for SARS-CoV-2 (novel coronavirus) in the past per his report. Despite her symptoms, patient has not taken any over the counter interventions to help improve/relieve her reported symptoms at home.    Past Medical History:  Diagnosis Date   Hypertension     Past Surgical History:  Procedure Laterality Date   ABDOMINAL HYSTERECTOMY     partial   BREAST CYST ASPIRATION Right    neg   toe spurs      Family History  Problem Relation Age of Onset   Heart disease Mother    Hypertension Mother    Breast cancer Neg Hx     Social History   Tobacco Use   Smoking status:  Current Every Day Smoker    Types: Cigarettes   Smokeless tobacco: Never Used   Tobacco comment: discussed smoking cessation- pt refuses/ has own plan  Substance Use Topics   Alcohol use: Yes    Comment: occasionally   Drug use: Never    There are no active problems to display for this patient.   Home Medications:    Current Meds  Medication Sig   lisinopril (ZESTRIL) 20 MG tablet Take by mouth.   losartan (COZAAR) 50 MG tablet Take 1 tablet (50 mg total) by mouth daily.    Allergies:   Penicillins  Review of Systems (ROS): Review of Systems  Constitutional: Negative for fatigue and fever.  HENT: Positive for congestion, postnasal drip and sore throat. Negative for ear pain, rhinorrhea, sinus pressure, sinus pain and sneezing.        (+) acute alteration in sense of taste and smell.   Eyes: Negative for pain, discharge and redness.  Respiratory: Negative for cough, chest tightness and shortness of breath.   Cardiovascular: Negative for chest pain and palpitations.  Gastrointestinal: Positive for nausea. Negative for abdominal pain, diarrhea and vomiting.  Musculoskeletal: Negative for arthralgias, back pain, myalgias and neck pain.  Skin: Negative for color change, pallor and rash.  Allergic/Immunologic: Positive for environmental allergies (seasonal).  Neurological: Negative for dizziness, syncope, weakness and headaches.  Hematological: Negative for adenopathy.     Vital Signs: Today's Vitals   03/28/19 1451 03/28/19 1453 03/28/19 1547 03/28/19 1548  BP:  Marland Kitchen)  159/87    Pulse:  71    Resp:  16    Temp:  98.7 F (37.1 C)    TempSrc:  Oral    SpO2:  99%    Weight: 140 lb (63.5 kg)     Height: 5\' 5"  (1.651 m)     PainSc:   2  2     Physical Exam: Physical Exam  Constitutional: She is oriented to person, place, and time and well-developed, well-nourished, and in no distress.  HENT:  Head: Normocephalic and atraumatic.  Right Ear: Hearing normal. No  tenderness. Tympanic membrane is not erythematous and not bulging. A middle ear effusion (serous) is present.  Left Ear: Hearing normal. No tenderness. Tympanic membrane is not erythematous and not bulging. A middle ear effusion (serous) is present.  Nose: Mucosal edema and rhinorrhea present. No sinus tenderness.  Mouth/Throat: Uvula is midline and mucous membranes are normal. Posterior oropharyngeal erythema (+) clear PND present. No oropharyngeal exudate.  Eyes: Pupils are equal, round, and reactive to light.  Neck: Normal range of motion. Neck supple.  Cardiovascular: Normal rate, regular rhythm, normal heart sounds and intact distal pulses.  Pulmonary/Chest: Effort normal and breath sounds normal.  Abdominal: Soft. Normal appearance and bowel sounds are normal. There is no abdominal tenderness.  Musculoskeletal: Normal range of motion.  Neurological: She is alert and oriented to person, place, and time. Gait normal.  Skin: Skin is warm and dry. No rash noted. She is not diaphoretic.  Psychiatric: Mood, memory, affect and judgment normal.  Nursing note and vitals reviewed.   Urgent Care Treatments / Results:  LABS: PLEASE NOTE: all labs that were ordered this encounter are listed, however only abnormal results are displayed. Labs Reviewed  NOVEL CORONAVIRUS, NAA (HOSP ORDER, SEND-OUT TO REF LAB; TAT 18-24 HRS)    EKG: -None  RADIOLOGY: No results found.  PROCEDURES: Procedures  MEDICATIONS RECEIVED THIS VISIT: Medications - No data to display  PERTINENT CLINICAL COURSE NOTES/UPDATES:   Initial Impression / Assessment and Plan / Urgent Care Course:  Pertinent labs & imaging results that were available during my care of the patient were personally reviewed by me and considered in my medical decision making (see lab/imaging section of note for values and interpretations).  Jamie Cervantes is a 58 y.o. female who presents to [facility] today with complaints of Nasal  Congestion   Patient overall well appearing and in no acute distress today in clinic. Presenting symptoms (see HPI) and exam as documented above. She presents with symptoms associated with SARS-CoV-2 (novel coronavirus). Discussed typical symptom constellation. Reviewed potential for infection and need for testing. Patient amenable to being tested. SARS-CoV-2 swab collected by certified clinical staff. Discussed variable turn around times associated with testing, as swabs are being processed at [facility], and have been taking between 2-5 days to come back. She was advised to self quarantine, per [facility] DHHS guidelines, until negative results received.   Presenting symptoms consistent with acute viral illness. Until ruled out with confirmatory lab testing, SARS-CoV-2 remains part of the differential. Her testing is pending at this time. I discussed with her that her symptoms are felt to be viral in nature, thus antibiotics would not offer her any relief or improve his symptoms any faster than conservative symptomatic management. Discussed supportive care measures at home during acute phase of illness. Patient to rest as much as possible. She was encouraged to ensure adequate hydration (water and ORS) to prevent dehydration and electrolyte derangements.  Patient may use APAP and/or IBU on an as needed basis for pain/fever. Recommended adding cetirizine or loratadine to help with resolving the ear effusions (serous). Offered medication to help with nausea, however patient declined.   Current clinical condition warrants patient being out of work in order to quarantine while waiting for testing results. She was provided with the appropriate documentation to provide to her place of employment that will allow for her to RTW on 03/31/2019 with no restrictions. RTW is contingent on her SARS-CoV-2 test results being reviewed as negative. These measures are being implemented out of an abundance of caution to prevent  transmission and spread during the current SARS-CoV-2 pandemic.  Discussed follow up with primary care physician in 1 week for re-evaluation if not improving. I have reviewed the follow up and strict return precautions for any new or worsening symptoms. Patient is aware of symptoms that would be deemed urgent/emergent, and would thus require further evaluation either here or in the emergency department. At the time of discharge, she verbalized understanding and consent with the discharge plan as it was reviewed with her. All questions were fielded by provider and/or clinic staff prior to patient discharge.    Final Clinical Impressions / Urgent Care Diagnoses:   Final diagnoses:  Encounter for laboratory testing for COVID-19 virus  Advice given about COVID-19 virus infection  Viral upper respiratory tract infection    New Prescriptions:  [place] Controlled Substance Registry consulted? Not Applicable  No orders of the defined types were placed in this encounter.   Recommended Follow up Care:  Patient encouraged to follow up with the following provider within the specified time frame, or sooner as dictated by the severity of her symptoms. As always, she was instructed that for any urgent/emergent care needs, she should seek care either here or in the emergency department for more immediate evaluation.  Follow-up Information    Juline Patch, MD In 1 week.   Specialty: Family Medicine Why: General reassessment of symptoms if not improving Contact information: [address] [place] [address] [phone]         NOTE: This note was prepared using Dragon dictation software along with smaller phrase technology. Despite my best ability to proofread, there is the potential that transcriptional errors may still occur from this process, and are completely unintentional.    Karen Kitchens, NP 03/28/19 1807

## 2019-03-28 NOTE — ED Triage Notes (Signed)
Patient states that she has been having headache, loss of taste/smell, congestion x 3 Days. Patient states that she would like to be checked for Covid.

## 2019-03-28 NOTE — Discharge Instructions (Signed)
It was very nice seeing you today in clinic. Thank you for entrusting me with your care.   Rest and make sure you are staying hydrated. There is a little fluid behind your ears. You indicated that you had allergies. Recommended adding Zyrtec or Claritin to help resolve this. May use Tylenol and/or Ibuprofen as needed for headache.  You were tested for SARS-CoV-2 (novel coronavirus) today. Testing is performed by an outside lab ([facility]) and has variable turn around times ranging between 2-5 days. Current recommendations from the the CDC and [place] DHHS require that you remain out of work in order to quarantine at home until negative test results are have been received. In the event that your test results are positive, you will be contacted with further directives. These measures are being implemented out of an abundance of caution to prevent transmission and spread during the current SARS-CoV-2 pandemic.  Make arrangements to follow up with your regular doctor in 1 week for re-evaluation if not improving. If your symptoms/condition worsens, please seek follow up care either here or in the ER. Please remember, our [place] providers are "right here with you" when you need [place].   Again, it was my pleasure to take care of you today. Thank you for choosing our clinic. I hope that you start to feel better quickly.   Honor Loh, MSN, APRN, FNP-C, CEN Advanced Practice Provider [facility]

## 2019-03-29 LAB — NOVEL CORONAVIRUS, NAA (HOSP ORDER, SEND-OUT TO REF LAB; TAT 18-24 HRS): SARS-CoV-2, NAA: NOT DETECTED

## 2019-03-30 ENCOUNTER — Telehealth: Payer: Self-pay | Admitting: Family Medicine

## 2019-03-30 DIAGNOSIS — F17210 Nicotine dependence, cigarettes, uncomplicated: Secondary | ICD-10-CM | POA: Diagnosis not present

## 2019-03-30 DIAGNOSIS — I10 Essential (primary) hypertension: Secondary | ICD-10-CM | POA: Diagnosis not present

## 2019-03-30 DIAGNOSIS — R110 Nausea: Secondary | ICD-10-CM | POA: Diagnosis not present

## 2019-03-30 DIAGNOSIS — Z880 Allergy status to penicillin: Secondary | ICD-10-CM | POA: Diagnosis not present

## 2019-03-30 DIAGNOSIS — R1013 Epigastric pain: Secondary | ICD-10-CM | POA: Diagnosis not present

## 2019-03-30 DIAGNOSIS — R519 Headache, unspecified: Secondary | ICD-10-CM | POA: Diagnosis not present

## 2019-03-30 DIAGNOSIS — Z90711 Acquired absence of uterus with remaining cervical stump: Secondary | ICD-10-CM | POA: Diagnosis not present

## 2019-03-30 NOTE — Telephone Encounter (Signed)
May offer VV for tomorrow

## 2019-03-30 NOTE — Telephone Encounter (Signed)
Patient has been experiencing symptoms of nausea, nagging stomach pain, loose stool,slight headache. She mention a covid test was done last Sunday which was negative.

## 2019-03-31 ENCOUNTER — Ambulatory Visit: Payer: BLUE CROSS/BLUE SHIELD | Admitting: Family Medicine

## 2019-07-09 ENCOUNTER — Ambulatory Visit: Payer: BC Managed Care – PPO | Attending: Internal Medicine

## 2019-07-09 DIAGNOSIS — Z23 Encounter for immunization: Secondary | ICD-10-CM

## 2019-07-09 NOTE — Progress Notes (Signed)
   Covid-19 Vaccination Clinic  Name:  Jamie Cervantes    MRN: [id number] DOB: 11-22-1960  07/09/2019  [name] was observed post Covid-19 immunization for 15 minutes without incident. She was provided with Vaccine Information Sheet and instruction to access the V-Safe system.   [name] was instructed to call 911 with any severe reactions post vaccine: Marland Kitchen Difficulty breathing  . Swelling of face and throat  . A fast heartbeat  . A bad rash all over body  . Dizziness and weakness   Immunizations Administered    Name Date Dose VIS Date Route   Pfizer COVID-19 Vaccine 07/09/2019 12:07 PM 0.3 mL 04/02/2019 Intramuscular   Manufacturer: [place]   Lot: [id number]   [place]: [id number]

## 2019-07-30 ENCOUNTER — Ambulatory Visit: Payer: BC Managed Care – PPO | Attending: Internal Medicine

## 2019-07-30 DIAGNOSIS — Z23 Encounter for immunization: Secondary | ICD-10-CM

## 2019-07-30 NOTE — Progress Notes (Signed)
   Covid-19 Vaccination Clinic  Name:  Jamie Cervantes    MRN: [id number] DOB: 01-10-1961  07/30/2019  Jamie Cervantes was observed post Covid-19 immunization for 15 minutes without incident. She was provided with Vaccine Information Sheet and instruction to access the V-Safe system.   Jamie Cervantes was instructed to call 911 with any severe reactions post vaccine: Marland Kitchen Difficulty breathing  . Swelling of face and throat  . A fast heartbeat  . A bad rash all over body  . Dizziness and weakness   Immunizations Administered    Name Date Dose VIS Date Route   Pfizer COVID-19 Vaccine 07/30/2019 12:12 PM 0.3 mL 04/02/2019 Intramuscular   Manufacturer: [place]   Lot: [id number]   [place]: [id number]

## 2019-08-02 ENCOUNTER — Ambulatory Visit (INDEPENDENT_AMBULATORY_CARE_PROVIDER_SITE_OTHER): Payer: BC Managed Care – PPO | Admitting: Family Medicine

## 2019-08-02 ENCOUNTER — Other Ambulatory Visit: Payer: Self-pay

## 2019-08-02 ENCOUNTER — Encounter: Payer: Self-pay | Admitting: Family Medicine

## 2019-08-02 VITALS — BP 130/88 | HR 64 | Ht 65.0 in | Wt 151.0 lb

## 2019-08-02 DIAGNOSIS — Z1231 Encounter for screening mammogram for malignant neoplasm of breast: Secondary | ICD-10-CM

## 2019-08-02 DIAGNOSIS — L821 Other seborrheic keratosis: Secondary | ICD-10-CM

## 2019-08-02 DIAGNOSIS — Z0000 Encounter for general adult medical examination without abnormal findings: Secondary | ICD-10-CM

## 2019-08-02 DIAGNOSIS — E782 Mixed hyperlipidemia: Secondary | ICD-10-CM | POA: Diagnosis not present

## 2019-08-02 NOTE — Progress Notes (Signed)
Date:  08/02/2019   Name:  Jamie Cervantes   DOB:  Jun 14, 1960   MRN:  [id number]   Chief Complaint: Annual Exam (no pap), colonoscopy referral, and Nevus (on chest)  Patient is a 59 year old female who presents for a comprehensive physical exam. The patient reports the following problems: mole concern. Health maintenance has been reviewed colonoscopy>   Lab Results  Component Value Date   CREATININE 1.04 (H) 02/27/2018   BUN 16 02/27/2018   NA 142 02/27/2018   K 4.3 02/27/2018   CL 102 02/27/2018   CO2 27 02/27/2018   No results found for: CHOL, HDL, LDLCALC, LDLDIRECT, TRIG, CHOLHDL No results found for: TSH No results found for: HGBA1C No results found for: WBC, HGB, HCT, MCV, PLT No results found for: ALT, AST, GGT, ALKPHOS, BILITOT   Review of Systems  Constitutional: Negative.  Negative for chills, fatigue, fever and unexpected weight change.  HENT: Negative for congestion, ear discharge, ear pain, rhinorrhea, sinus pressure, sneezing and sore throat.   Eyes: Negative for photophobia, pain, discharge, redness and itching.  Respiratory: Negative for cough, shortness of breath, wheezing and stridor.   Gastrointestinal: Negative for abdominal pain, blood in stool, constipation, diarrhea, nausea and vomiting.  Endocrine: Negative for cold intolerance, heat intolerance, polydipsia, polyphagia and polyuria.  Genitourinary: Negative for dysuria, flank pain, frequency, hematuria, menstrual problem, pelvic pain, urgency, vaginal bleeding and vaginal discharge.  Musculoskeletal: Negative for arthralgias, back pain and myalgias.  Skin: Negative for rash.  Allergic/Immunologic: Negative for environmental allergies and food allergies.  Neurological: Negative for dizziness, weakness, light-headedness, numbness and headaches.  Hematological: Negative for adenopathy. Does not bruise/bleed easily.  Psychiatric/Behavioral: Negative for dysphoric mood. The patient is not nervous/anxious.       There are no problems to display for this patient.   Allergies  Allergen Reactions  . Penicillins Nausea Only    Past Surgical History:  Procedure Laterality Date  . ABDOMINAL HYSTERECTOMY     partial  . BREAST CYST ASPIRATION Right    neg  . toe spurs      Social History   Tobacco Use  . Smoking status: Current Every Day Smoker    Types: Cigarettes  . Smokeless tobacco: Never Used  . Tobacco comment: discussed smoking cessation- pt refuses/ has own plan  Substance Use Topics  . Alcohol use: Yes    Comment: occasionally  . Drug use: Never     Medication list has been reviewed and updated.  Current Meds  Medication Sig  . hydrochlorothiazide (HYDRODIURIL) 25 MG tablet Take 25 mg by mouth daily. cardio  . lisinopril (ZESTRIL) 10 MG tablet Take 10 mg by mouth daily. cardio  . [DISCONTINUED] lisinopril (ZESTRIL) 20 MG tablet Take by mouth.  . [DISCONTINUED] losartan (COZAAR) 50 MG tablet Take 1 tablet (50 mg total) by mouth daily.    PHQ 2/9 Scores 02/27/2018  PHQ - 2 Score 1  PHQ- 9 Score 4    BP Readings from Last 3 Encounters:  08/02/19 130/88  03/28/19 (!) 159/87  01/09/19 128/87    Physical Exam Vitals and nursing note reviewed.  Constitutional:      General: She is not in acute distress.    Appearance: Normal appearance. She is well-groomed. She is not diaphoretic.  HENT:     Head: Normocephalic and atraumatic.     Jaw: There is normal jaw occlusion.     Right Ear: Hearing, tympanic membrane, ear canal and external ear  normal. There is no impacted cerumen.     Left Ear: Hearing, tympanic membrane, ear canal and external ear normal. There is no impacted cerumen.     Nose: Nose normal. No congestion or rhinorrhea.     Right Sinus: No maxillary sinus tenderness or frontal sinus tenderness.     Left Sinus: No maxillary sinus tenderness or frontal sinus tenderness.     Mouth/Throat:     Lips: Pink.     Mouth: Mucous membranes are moist.      Dentition: Normal dentition.  Eyes:     General: Lids are normal. Vision grossly intact. Gaze aligned appropriately.        Right eye: No discharge.        Left eye: No discharge.     Conjunctiva/sclera: Conjunctivae normal.     Pupils: Pupils are equal, round, and reactive to light.  Neck:     Thyroid: No thyroid mass, thyromegaly or thyroid tenderness.     Vascular: Normal carotid pulses. No carotid bruit, hepatojugular reflux or JVD.     Trachea: Trachea and phonation normal.  Cardiovascular:     Rate and Rhythm: Normal rate and regular rhythm.     Chest Wall: PMI is not displaced.     Pulses: Normal pulses.          Carotid pulses are 2+ on the right side and 2+ on the left side.      Radial pulses are 2+ on the right side and 2+ on the left side.       Femoral pulses are 2+ on the right side and 2+ on the left side.      Popliteal pulses are 2+ on the right side and 2+ on the left side.       Dorsalis pedis pulses are 2+ on the right side and 2+ on the left side.       Posterior tibial pulses are 2+ on the right side and 2+ on the left side.     Heart sounds: Normal heart sounds, S1 normal and S2 normal. No murmur. No systolic murmur. No diastolic murmur. No friction rub. No gallop. No S3 or S4 sounds.   Pulmonary:     Effort: Pulmonary effort is normal.     Breath sounds: Normal breath sounds. No decreased breath sounds, wheezing, rhonchi or rales.  Chest:     Chest wall: No mass.     Breasts: Breasts are symmetrical.        Right: Normal. No swelling, bleeding, inverted nipple, mass, nipple discharge, skin change or tenderness.        Left: Normal. No swelling, bleeding, inverted nipple, mass, nipple discharge, skin change or tenderness.  Abdominal:     General: Bowel sounds are normal.     Palpations: Abdomen is soft. There is no hepatomegaly, splenomegaly or mass.     Tenderness: There is no abdominal tenderness. There is no right CVA tenderness, left CVA tenderness or  guarding.  Genitourinary:    Rectum: Guaiac result negative. No mass, tenderness or external hemorrhoid.  Musculoskeletal:        General: Normal range of motion.     Cervical back: Normal, full passive range of motion without pain, normal range of motion and neck supple.     Thoracic back: Normal.     Lumbar back: Normal.     Right lower leg: 1+ Pitting Edema present.     Left lower leg: 1+ Pitting Edema present.  Right foot: Bunion present.     Left foot: Bunion present.  Lymphadenopathy:     Head:     Right side of head: No submental or submandibular adenopathy.     Left side of head: No submental or submandibular adenopathy.     Cervical: No cervical adenopathy.     Right cervical: No superficial cervical adenopathy.    Left cervical: No superficial cervical adenopathy.     Upper Body:     Right upper body: No supraclavicular or axillary adenopathy.     Left upper body: No supraclavicular or axillary adenopathy.  Skin:    General: Skin is warm and dry.     Capillary Refill: Capillary refill takes less than 2 seconds.     Comments: Large seb keratose or skin tag and/or keloid left chest  Neurological:     General: No focal deficit present.     Mental Status: She is alert.     Cranial Nerves: Cranial nerves are intact.     Sensory: Sensation is intact.     Motor: Motor function is intact.     Deep Tendon Reflexes: Reflexes are normal and symmetric.  Psychiatric:        Attention and Perception: Attention normal.        Mood and Affect: Mood and affect normal.        Speech: Speech normal.        Behavior: Behavior is cooperative.        Cognition and Memory: Cognition normal.     Wt Readings from Last 3 Encounters:  08/02/19 151 lb (68.5 kg)  03/28/19 140 lb (63.5 kg)  01/09/19 150 lb (68 kg)    BP 130/88   Pulse 64   Ht 5\' 5"  (1.651 m)   Wt 151 lb (68.5 kg)   BMI 25.13 kg/m   Assessment and Plan: 1. Annual physical exam No subjective/objective concerns  noted during history and physical exam.  Review of patient's previous encounters, most recent labs, most recent imaging, and care everywhere is unremarkable.HARVETTA SOHMER is a 59 y.o. female who presents today for her Complete Annual Exam. She feels well. She reports exercising . She reports she is sleeping well. Immunizations are reviewed and recommendations provided.   Age appropriate screening tests are discussed. Counseling given for risk factor reduction interventions.  Will check renal function panel and lipid panel for baseline monitoring. - Renal Function Panel - Lipid Panel With LDL/HDL Ratio  2. Breast cancer screening by mammogram Discussed and patient has been scheduled for mammogram. - MM 3D SCREEN BREAST BILATERAL; Future  3. Seborrheic keratoses Patient has a large either keloid or seborrheic keratosis that is inflamed in the left chest area.  We will refer to dermatology for evaluation and removal if possible. - Ambulatory referral to Dermatology

## 2019-08-03 LAB — RENAL FUNCTION PANEL
Albumin: 4.6 g/dL (ref 3.8–4.9)
BUN/Creatinine Ratio: 14 (ref 12–28)
BUN: 15 mg/dL (ref 8–27)
CO2: 23 mmol/L (ref 20–29)
Calcium: 10.2 mg/dL (ref 8.7–10.3)
Chloride: 102 mmol/L (ref 96–106)
Creatinine, Ser: 1.06 mg/dL — ABNORMAL HIGH (ref 0.57–1.00)
GFR calc Af Amer: 66 mL/min/{1.73_m2} (ref >59)
GFR calc non Af Amer: 57 mL/min/{1.73_m2} — ABNORMAL LOW (ref >59)
Glucose: 123 mg/dL — ABNORMAL HIGH (ref 65–99)
Phosphorus: 3.5 mg/dL (ref 3.0–4.3)
Potassium: 4.2 mmol/L (ref 3.5–5.2)
Sodium: 141 mmol/L (ref 134–144)

## 2019-08-03 LAB — LIPID PANEL WITH LDL/HDL RATIO
Cholesterol, Total: 242 mg/dL — ABNORMAL HIGH (ref 100–199)
HDL: 69 mg/dL (ref >39)
LDL Chol Calc (NIH): 164 mg/dL — ABNORMAL HIGH (ref 0–99)
LDL/HDL Ratio: 2.4 ratio (ref 0.0–3.2)
Triglycerides: 57 mg/dL (ref 0–149)
VLDL Cholesterol Cal: 9 mg/dL (ref 5–40)

## 2019-08-05 DIAGNOSIS — D485 Neoplasm of uncertain behavior of skin: Secondary | ICD-10-CM | POA: Diagnosis not present

## 2019-08-17 ENCOUNTER — Other Ambulatory Visit: Payer: Self-pay

## 2019-08-17 ENCOUNTER — Encounter: Payer: Self-pay | Admitting: Emergency Medicine

## 2019-08-17 ENCOUNTER — Emergency Department: Payer: BC Managed Care – PPO

## 2019-08-17 ENCOUNTER — Emergency Department
Admission: EM | Admit: 2019-08-17 | Discharge: 2019-08-17 | Disposition: A | Discharge status: Home / Self Care | Payer: BC Managed Care – PPO | Attending: Emergency Medicine | Admitting: Emergency Medicine

## 2019-08-17 DIAGNOSIS — R079 Chest pain, unspecified: Secondary | ICD-10-CM | POA: Diagnosis not present

## 2019-08-17 DIAGNOSIS — R0789 Other chest pain: Secondary | ICD-10-CM | POA: Insufficient documentation

## 2019-08-17 DIAGNOSIS — Z5321 Procedure and treatment not carried out due to patient leaving prior to being seen by health care provider: Secondary | ICD-10-CM | POA: Insufficient documentation

## 2019-08-17 DIAGNOSIS — I10 Essential (primary) hypertension: Secondary | ICD-10-CM | POA: Diagnosis not present

## 2019-08-17 LAB — COMPREHENSIVE METABOLIC PANEL
ALT: 15 U/L (ref 0–44)
AST: 17 U/L (ref 15–41)
Albumin: 4.3 g/dL (ref 3.5–5.0)
Alkaline Phosphatase: 95 U/L (ref 38–126)
Anion gap: 8 (ref 5–15)
BUN: 18 mg/dL (ref 6–20)
CO2: 27 mmol/L (ref 22–32)
Calcium: 9.7 mg/dL (ref 8.9–10.3)
Chloride: 105 mmol/L (ref 98–111)
Creatinine, Ser: 1.08 mg/dL — ABNORMAL HIGH (ref 0.44–1.00)
GFR calc Af Amer: >60 mL/min (ref >60)
GFR calc non Af Amer: 56 mL/min — ABNORMAL LOW (ref >60)
Glucose, Bld: 117 mg/dL — ABNORMAL HIGH (ref 70–99)
Potassium: 4.1 mmol/L (ref 3.5–5.1)
Sodium: 140 mmol/L (ref 135–145)
Total Bilirubin: 0.6 mg/dL (ref 0.3–1.2)
Total Protein: 8 g/dL (ref 6.5–8.1)

## 2019-08-17 LAB — CBC WITH DIFFERENTIAL/PLATELET
Abs Immature Granulocytes: 0.06 10*3/uL (ref 0.00–0.07)
Basophils Absolute: 0.1 10*3/uL (ref 0.0–0.1)
Basophils Relative: 0 %
Eosinophils Absolute: 0.2 10*3/uL (ref 0.0–0.5)
Eosinophils Relative: 1 %
HCT: 38.7 % (ref 36.0–46.0)
Hemoglobin: 13.1 g/dL (ref 12.0–15.0)
Immature Granulocytes: 0 %
Lymphocytes Relative: 49 %
Lymphs Abs: 6.9 10*3/uL — ABNORMAL HIGH (ref 0.7–4.0)
MCH: 28.4 pg (ref 26.0–34.0)
MCHC: 33.9 g/dL (ref 30.0–36.0)
MCV: 83.8 fL (ref 80.0–100.0)
Monocytes Absolute: 1.1 10*3/uL — ABNORMAL HIGH (ref 0.1–1.0)
Monocytes Relative: 8 %
Neutro Abs: 5.9 10*3/uL (ref 1.7–7.7)
Neutrophils Relative %: 42 %
Platelets: 266 10*3/uL (ref 150–400)
RBC: 4.62 MIL/uL (ref 3.87–5.11)
RDW: 14.7 % (ref 11.5–15.5)
Smear Review: NORMAL
WBC: 14.1 10*3/uL — ABNORMAL HIGH (ref 4.0–10.5)
nRBC: 0 % (ref 0.0–0.2)

## 2019-08-17 LAB — TROPONIN I (HIGH SENSITIVITY)
Troponin I (High Sensitivity): 4 ng/L (ref <18)
Troponin I (High Sensitivity): 4 ng/L (ref <18)

## 2019-08-17 NOTE — ED Triage Notes (Signed)
Pt to triage via w/c with no distress noted, mask in place; EMS brought pt in from work for c/o mid CP, nonradiating since 2am with no accomp symptoms; denies hx of same

## 2019-08-19 ENCOUNTER — Telehealth: Payer: Self-pay | Admitting: Emergency Medicine

## 2019-08-19 NOTE — Telephone Encounter (Signed)
Called patient due to lwot to inquire about condition and follow up plans. Says she is feeling better. I encouraged her to inform her pcp of her symptoms and told her pcp could review lab results.  Says she will call now.

## 2019-08-23 ENCOUNTER — Other Ambulatory Visit: Payer: Self-pay

## 2019-08-23 ENCOUNTER — Ambulatory Visit
Admission: RE | Admit: 2019-08-23 | Discharge: 2019-08-23 | Disposition: A | Discharge status: Home / Self Care | Payer: BC Managed Care – PPO | Source: Ambulatory Visit | Attending: Family Medicine | Admitting: Family Medicine

## 2019-08-23 DIAGNOSIS — Z1231 Encounter for screening mammogram for malignant neoplasm of breast: Secondary | ICD-10-CM | POA: Insufficient documentation

## 2019-12-07 IMAGING — MG DIGITAL SCREENING BILATERAL MAMMOGRAM WITH TOMO AND CAD
8 series · 8 of 24 positions shown · non-contrast
Comparison: Previous exam(s).

CLINICAL DATA: Screening.

EXAM:
DIGITAL SCREENING BILATERAL MAMMOGRAM WITH TOMO AND CAD

[L CC synth-2D]
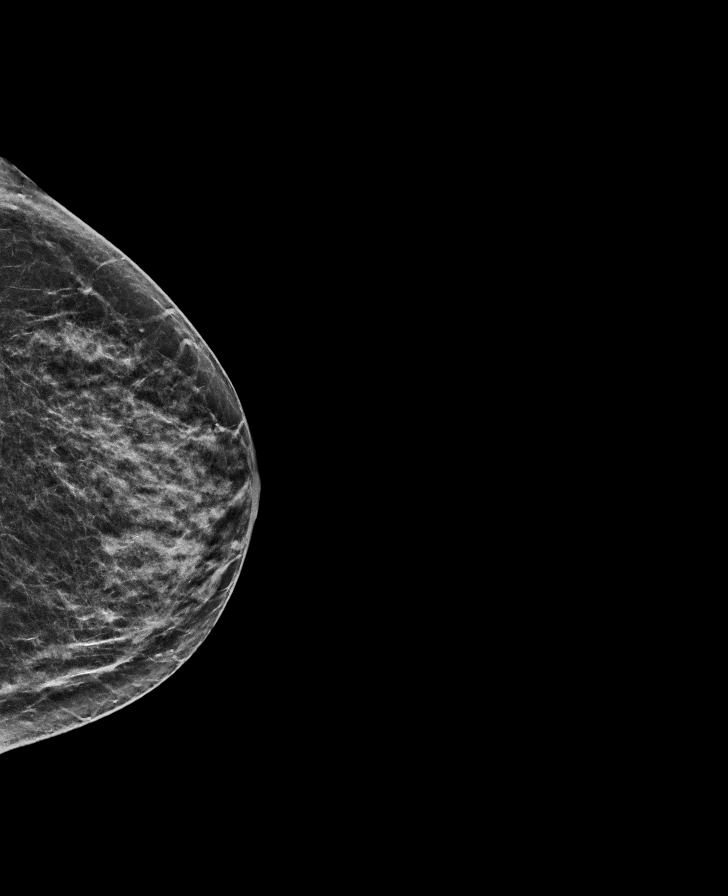

[L MLO synth-2D]
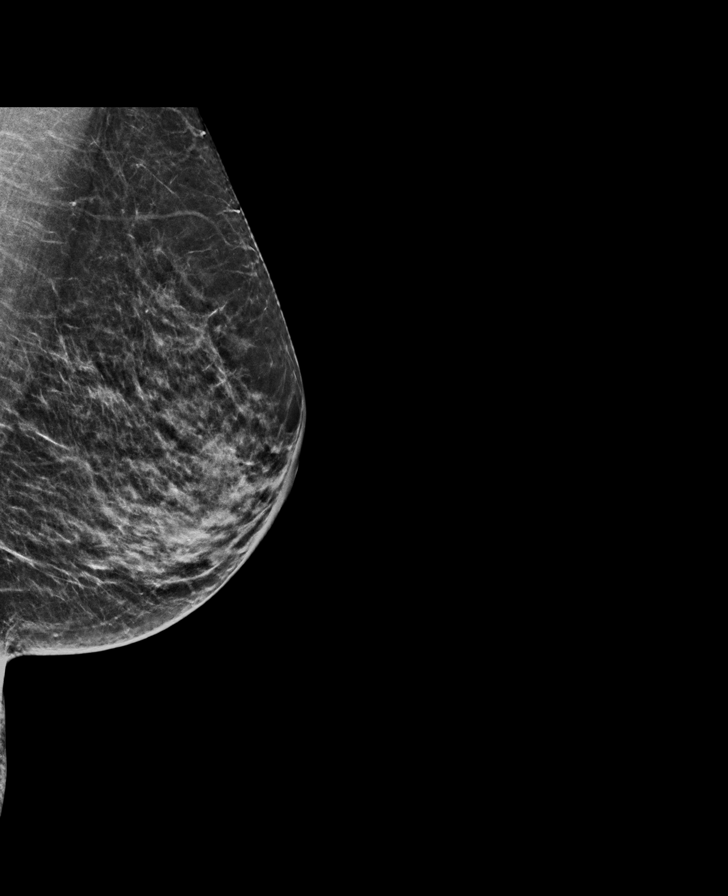

[R CC synth-2D]
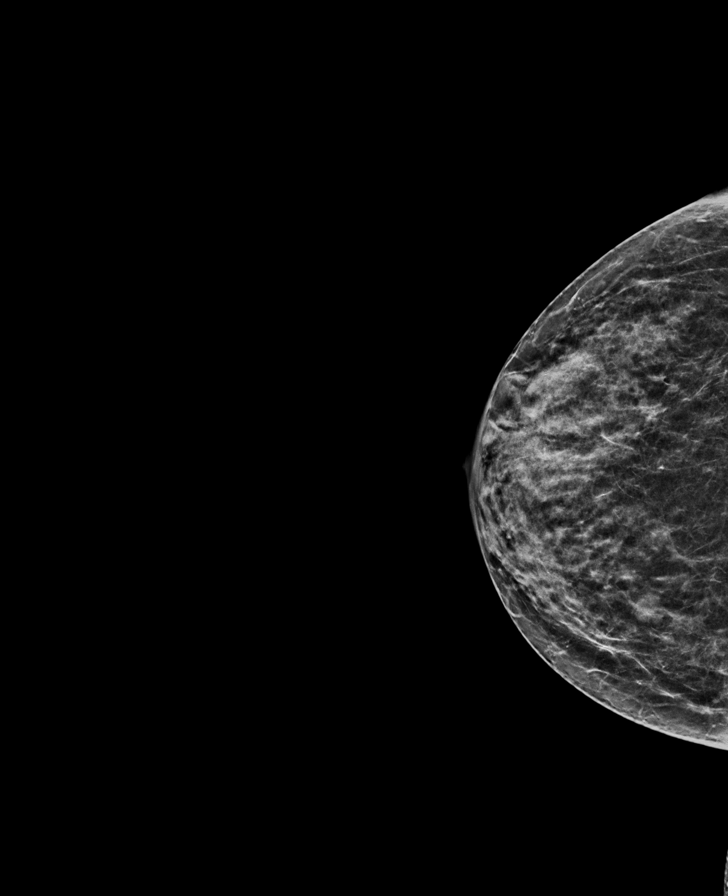

[R MLO synth-2D]
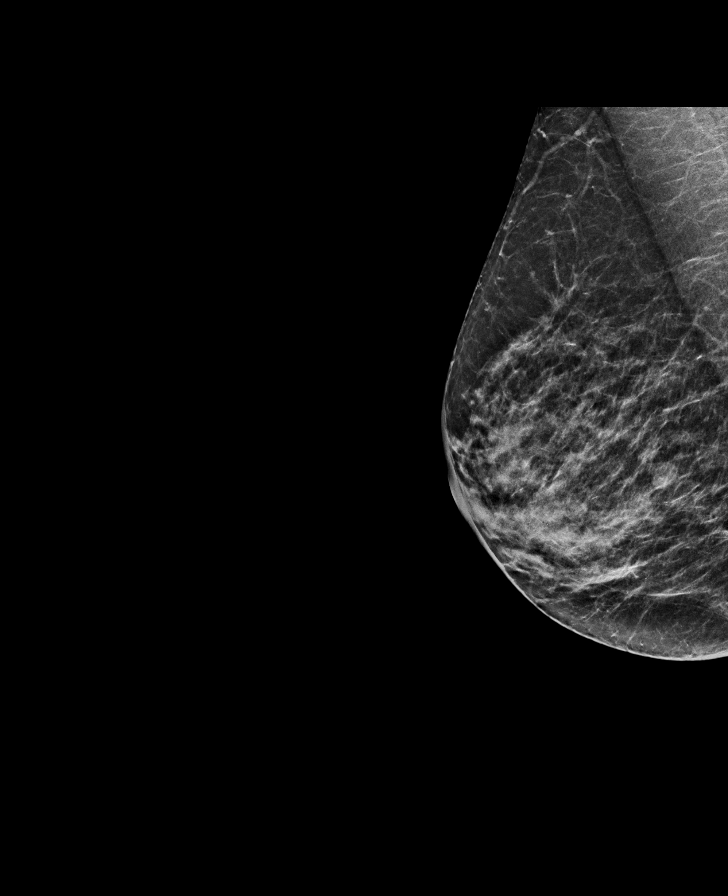

[R MLO tomo · tomo slice 29/56.0]
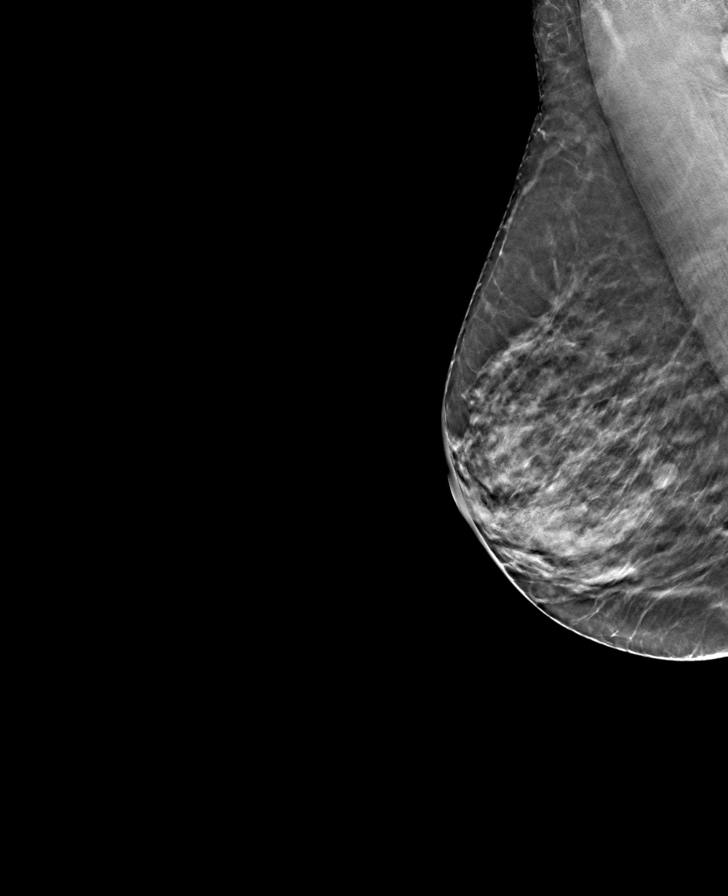

[L MLO tomo · tomo slice 29/57.0]
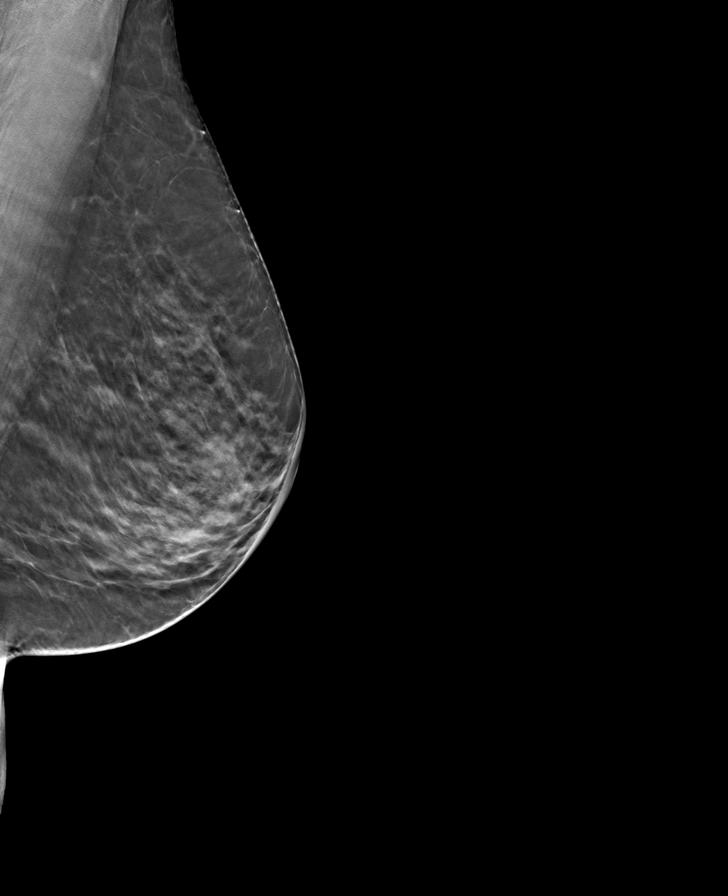

[L CC tomo · tomo slice 31/60.0]
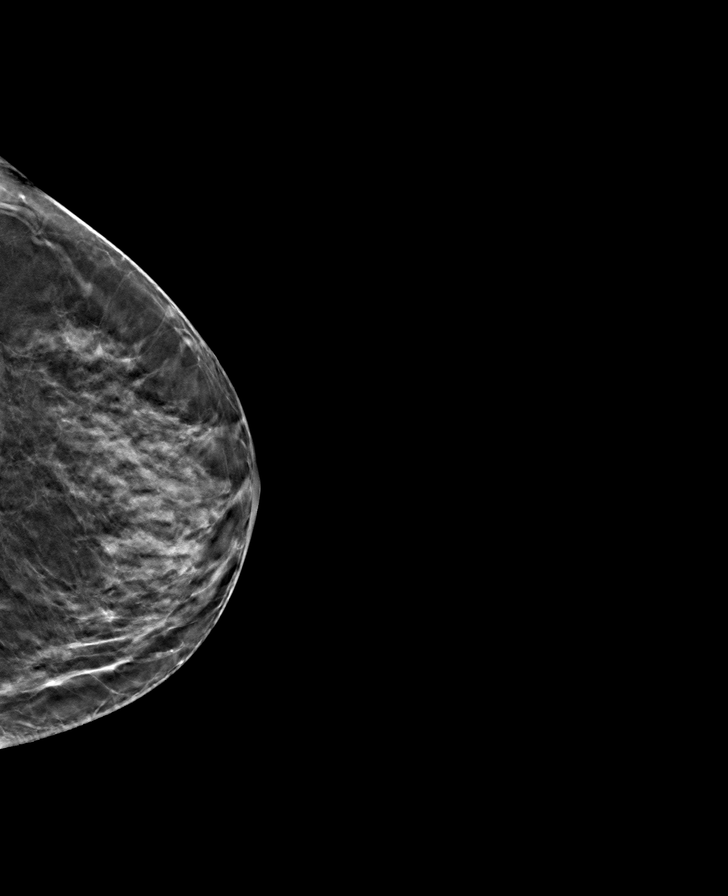

[R CC tomo · tomo slice 29/56.0]
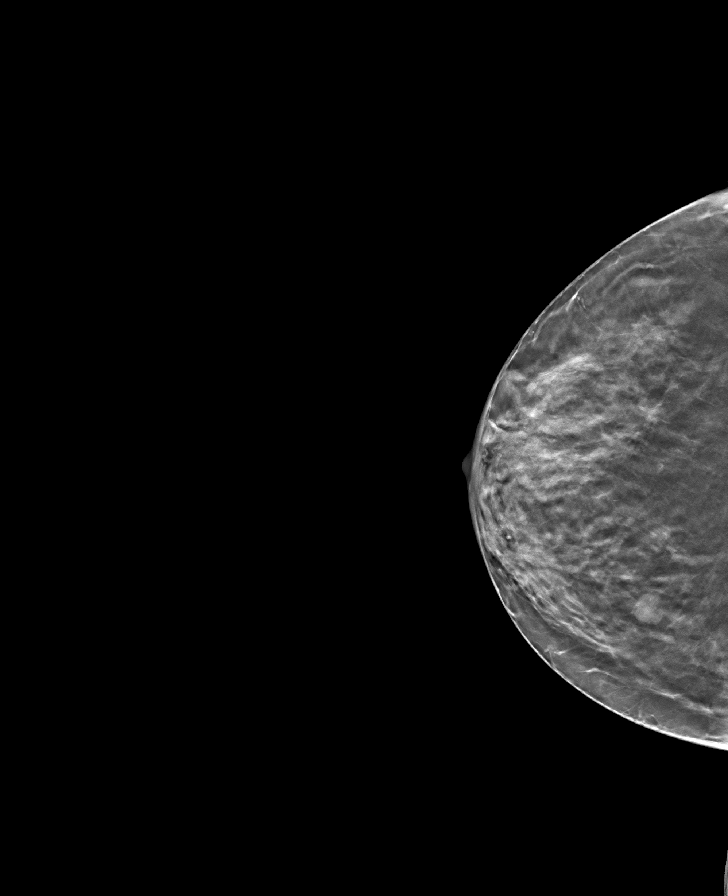

[8 of 24 positions shown; findings below may reference images not displayed]

ACR Breast Density Category c: The breast tissue is heterogeneously
dense, which may obscure small masses.
FINDINGS: There are no findings suspicious for malignancy. Images were
processed with CAD.
IMPRESSION: No mammographic evidence of malignancy. A result letter of this
screening mammogram will be mailed directly to the patient.

RECOMMENDATION:
Screening mammogram in one year. (Code:FT-U-LHB)

BI-RADS CATEGORY  1: Negative.

## 2020-02-11 ENCOUNTER — Ambulatory Visit: Payer: BC Managed Care – PPO | Admitting: Family Medicine

## 2020-02-11 ENCOUNTER — Encounter: Payer: Self-pay | Admitting: Family Medicine

## 2020-02-11 ENCOUNTER — Other Ambulatory Visit: Payer: Self-pay

## 2020-02-11 VITALS — BP 110/80 | HR 60 | Ht 65.0 in | Wt 151.0 lb

## 2020-02-11 DIAGNOSIS — Z1211 Encounter for screening for malignant neoplasm of colon: Secondary | ICD-10-CM

## 2020-02-11 DIAGNOSIS — N61 Mastitis without abscess: Secondary | ICD-10-CM

## 2020-02-11 MED ORDER — SULFAMETHOXAZOLE-TRIMETHOPRIM 800-160 MG PO TABS: 1.0000 | ORAL_TABLET | Freq: Two times a day (BID) | ORAL | 0 refills | Status: DC

## 2020-02-11 MED ORDER — MELOXICAM 7.5 MG PO TABS: 7.5000 mg | ORAL_TABLET | Freq: Every day | ORAL | 0 refills | Status: DC

## 2020-02-11 NOTE — Progress Notes (Signed)
Date:  02/11/2020   Name:  Jamie Cervantes   DOB:  05-Feb-1961   MRN:  [id number]   Chief Complaint: Colon Cancer Screening (referral to GI for colon.) and axilla tenderness (R) axilla tenderness and swelling)  Patient is a 59 year old female who presents for a right breast pain radiating to axillary The patient reports the following problems: as is. Health maintenance has been reviewed colon cancer.   Lab Results  Component Value Date   CREATININE 1.08 (H) 08/17/2019   BUN 18 08/17/2019   NA 140 08/17/2019   K 4.1 08/17/2019   CL 105 08/17/2019   CO2 27 08/17/2019   Lab Results  Component Value Date   CHOL 242 (H) 08/02/2019   HDL 69 08/02/2019   LDLCALC 164 (H) 08/02/2019   TRIG 57 08/02/2019   No results found for: TSH No results found for: HGBA1C Lab Results  Component Value Date   WBC 14.1 (H) 08/17/2019   HGB 13.1 08/17/2019   HCT 38.7 08/17/2019   MCV 83.8 08/17/2019   PLT 266 08/17/2019   Lab Results  Component Value Date   ALT 15 08/17/2019   AST 17 08/17/2019   ALKPHOS 95 08/17/2019   BILITOT 0.6 08/17/2019     Review of Systems  Constitutional: Negative.  Negative for chills, fatigue, fever and unexpected weight change.  HENT: Negative for congestion, ear discharge, ear pain, rhinorrhea, sinus pressure, sneezing and sore throat.   Eyes: Negative for photophobia, pain, discharge, redness and itching.  Respiratory: Negative for cough, shortness of breath, wheezing and stridor.   Gastrointestinal: Negative for abdominal pain, blood in stool, constipation, diarrhea, nausea and vomiting.  Endocrine: Negative for cold intolerance, heat intolerance, polydipsia, polyphagia and polyuria.  Genitourinary: Negative for dysuria, flank pain, frequency, hematuria, menstrual problem, pelvic pain, urgency, vaginal bleeding and vaginal discharge.  Musculoskeletal: Negative for arthralgias, back pain and myalgias.  Skin: Negative for rash.  Allergic/Immunologic:  Negative for environmental allergies and food allergies.  Neurological: Negative for dizziness, weakness, light-headedness, numbness and headaches.  Hematological: Negative for adenopathy. Does not bruise/bleed easily.  Psychiatric/Behavioral: Negative for dysphoric mood. The patient is not nervous/anxious.     There are no problems to display for this patient.   Allergies  Allergen Reactions  . Penicillins Nausea Only    Past Surgical History:  Procedure Laterality Date  . ABDOMINAL HYSTERECTOMY     partial  . BREAST CYST ASPIRATION Right    neg  . toe spurs      Social History   Tobacco Use  . Smoking status: Current Every Day Smoker    Types: Cigarettes  . Smokeless tobacco: Never Used  . Tobacco comment: discussed smoking cessation- pt refuses/ has own plan  Vaping Use  . Vaping Use: Never used  Substance Use Topics  . Alcohol use: Yes    Comment: occasionally  . Drug use: Never     Medication list has been reviewed and updated.  Current Meds  Medication Sig  . hydrochlorothiazide (HYDRODIURIL) 25 MG tablet Take 25 mg by mouth daily. cardio  . lisinopril (ZESTRIL) 10 MG tablet Take 10 mg by mouth daily. cardio    PHQ 2/9 Scores 02/11/2020 02/27/2018  PHQ - 2 Score 0 1  PHQ- 9 Score 0 4    GAD 7 : Generalized Anxiety Score 02/11/2020  Nervous, Anxious, on Edge 0  Control/stop worrying 0  Worry too much - different things 0  Trouble relaxing 0  Restless 0  Easily annoyed or irritable 0  Afraid - awful might happen 0  Total GAD 7 Score 0    BP Readings from Last 3 Encounters:  02/11/20 110/80  08/17/19 115/68  08/02/19 130/88    Physical Exam Vitals and nursing note reviewed.  Constitutional:      General: She is not in acute distress.    Appearance: She is not diaphoretic.  HENT:     Head: Normocephalic and atraumatic.     Right Ear: Tympanic membrane, ear canal and external ear normal. There is no impacted cerumen.     Left Ear: Tympanic  membrane, ear canal and external ear normal. There is no impacted cerumen.     Nose: Nose normal. No congestion or rhinorrhea.     Mouth/Throat:     Mouth: Mucous membranes are moist.  Eyes:     General:        Right eye: No discharge.        Left eye: No discharge.     Conjunctiva/sclera: Conjunctivae normal.     Pupils: Pupils are equal, round, and reactive to light.  Neck:     Thyroid: No thyromegaly.     Vascular: No JVD.  Cardiovascular:     Rate and Rhythm: Normal rate and regular rhythm.     Heart sounds: Normal heart sounds. No murmur heard.  No friction rub. No gallop.   Pulmonary:     Effort: Pulmonary effort is normal.     Breath sounds: Normal breath sounds. No wheezing, rhonchi or rales.  Chest:     Chest wall: No tenderness.  Abdominal:     General: Bowel sounds are normal.     Palpations: Abdomen is soft. There is no mass.     Tenderness: There is no abdominal tenderness. There is no guarding.  Musculoskeletal:        General: Normal range of motion.     Cervical back: Normal range of motion and neck supple.  Lymphadenopathy:     Cervical: No cervical adenopathy.  Skin:    General: Skin is warm and dry.  Neurological:     Mental Status: She is alert.     Deep Tendon Reflexes: Reflexes are normal and symmetric.     Wt Readings from Last 3 Encounters:  02/11/20 151 lb (68.5 kg)  08/17/19 151 lb (68.5 kg)  08/02/19 151 lb (68.5 kg)    BP 110/80   Pulse 60   Ht 5\' 5"  (1.651 m)   Wt 151 lb (68.5 kg)   BMI 25.13 kg/m   Assessment and Plan:  1. Colon cancer screening Discussed with patient and will refer to gastroenterology for screening. - Ambulatory referral to Gastroenterology  2. Mastitis Patient had relatively recent onset of discomfort in the upper outer quadrant of the right breast.  There are some mild swelling that is occurred over the past 24/48 hrs.  There is tenderness in the area of breast tissue.  There is no palpable mass.  There is no  adenopathy but there is some tenderness in the axillary suggesting some adenitis.  Review of patient's mammogram is unremarkable for concern.  We will treat with antibiotic Septra DS twice daily for 10 days and meloxicam 7.5 mg once a day for inflammation.  Patient is to call Monday if unresolved. - sulfamethoxazole-trimethoprim (BACTRIM DS) 800-160 MG tablet; Take 1 tablet by mouth 2 (two) times daily.  Dispense: 20 tablet; Refill: 0 - meloxicam (MOBIC) 7.5 MG tablet;  Take 1 tablet (7.5 mg total) by mouth daily.  Dispense: 30 tablet; Refill: 0

## 2020-02-22 ENCOUNTER — Encounter: Payer: Self-pay | Admitting: *Deleted

## 2020-03-05 ENCOUNTER — Other Ambulatory Visit: Payer: Self-pay | Admitting: Family Medicine

## 2020-03-05 DIAGNOSIS — N61 Mastitis without abscess: Secondary | ICD-10-CM

## 2020-03-05 NOTE — Telephone Encounter (Signed)
Requested Prescriptions  Pending Prescriptions Disp Refills  . meloxicam (MOBIC) 7.5 MG tablet [Pharmacy Med Name: MELOXICAM 7.5 MG TABLET] 30 tablet 0    Sig: TAKE 1 TABLET BY MOUTH EVERY DAY     Analgesics:  COX2 Inhibitors Failed - 03/05/2020 10:27 AM      Failed - Cr in normal range and within 360 days    Creatinine, Ser  Date Value Ref Range Status  08/17/2019 1.08 (H) 0.44 - 1.00 mg/dL Final         Passed - HGB in normal range and within 360 days    Hemoglobin  Date Value Ref Range Status  08/17/2019 13.1 12.0 - 15.0 g/dL Final         Passed - Patient is not pregnant      Passed - Valid encounter within last 12 months    Recent Outpatient Visits          3 weeks ago [facility] Juline Patch, MD   7 months ago Annual physical exam   [facility] Juline Patch, MD   1 year ago Acute maxillary sinusitis, recurrence not specified   [facility] Juline Patch, MD   1 year ago Acute non-recurrent maxillary sinusitis   [facility] Juline Patch, MD   1 year ago Annual physical exam   [facility] Juline Patch, MD      Future Appointments            In 5 months Juline Patch, MD [facility], [facility]

## 2020-03-29 ENCOUNTER — Other Ambulatory Visit: Payer: Self-pay | Admitting: Family Medicine

## 2020-03-29 DIAGNOSIS — N61 Mastitis without abscess: Secondary | ICD-10-CM

## 2020-08-07 ENCOUNTER — Encounter: Payer: Self-pay | Admitting: Family Medicine

## 2020-08-07 ENCOUNTER — Ambulatory Visit (INDEPENDENT_AMBULATORY_CARE_PROVIDER_SITE_OTHER): Payer: BC Managed Care – PPO | Admitting: Family Medicine

## 2020-08-07 ENCOUNTER — Other Ambulatory Visit: Payer: Self-pay

## 2020-08-07 VITALS — BP 138/92 | HR 68 | Ht 65.0 in | Wt 148.0 lb

## 2020-08-07 DIAGNOSIS — Z1211 Encounter for screening for malignant neoplasm of colon: Secondary | ICD-10-CM | POA: Diagnosis not present

## 2020-08-07 DIAGNOSIS — I10 Essential (primary) hypertension: Secondary | ICD-10-CM | POA: Diagnosis not present

## 2020-08-07 DIAGNOSIS — F17210 Nicotine dependence, cigarettes, uncomplicated: Secondary | ICD-10-CM | POA: Diagnosis not present

## 2020-08-07 DIAGNOSIS — Z1231 Encounter for screening mammogram for malignant neoplasm of breast: Secondary | ICD-10-CM

## 2020-08-07 DIAGNOSIS — Z0000 Encounter for general adult medical examination without abnormal findings: Secondary | ICD-10-CM

## 2020-08-07 DIAGNOSIS — R310 Gross hematuria: Secondary | ICD-10-CM

## 2020-08-07 DIAGNOSIS — Z90710 Acquired absence of both cervix and uterus: Secondary | ICD-10-CM

## 2020-08-07 LAB — HEMOCCULT GUIAC POC 1CARD (OFFICE): Fecal Occult Blood, POC: NEGATIVE

## 2020-08-07 LAB — POCT URINALYSIS DIPSTICK
Bilirubin, UA: NEGATIVE
Blood, UA: POSITIVE
Glucose, UA: NEGATIVE
Ketones, UA: NEGATIVE
Leukocytes, UA: NEGATIVE
Nitrite, UA: NEGATIVE
Protein, UA: NEGATIVE
Spec Grav, UA: 1.015 (ref 1.010–1.025)
Urobilinogen, UA: 0.2 E.U./dL
pH, UA: 6 (ref 5.0–8.0)

## 2020-08-07 NOTE — Progress Notes (Signed)
Date:  08/07/2020   Name:  Jamie Cervantes   DOB:  02-24-1961   MRN:  [id number]   Chief Complaint: Annual Exam (No pap)  Patient is a 60 year old female who presents for a comprehensive physical exam. The patient reports the following problems: none. Health maintenance has been reviewed mammogram/colonoscopy.   Lab Results  Component Value Date   CREATININE 1.08 (H) 08/17/2019   BUN 18 08/17/2019   NA 140 08/17/2019   K 4.1 08/17/2019   CL 105 08/17/2019   CO2 27 08/17/2019   Lab Results  Component Value Date   CHOL 242 (H) 08/02/2019   HDL 69 08/02/2019   LDLCALC 164 (H) 08/02/2019   TRIG 57 08/02/2019   No results found for: TSH No results found for: HGBA1C Lab Results  Component Value Date   WBC 14.1 (H) 08/17/2019   HGB 13.1 08/17/2019   HCT 38.7 08/17/2019   MCV 83.8 08/17/2019   PLT 266 08/17/2019   Lab Results  Component Value Date   ALT 15 08/17/2019   AST 17 08/17/2019   ALKPHOS 95 08/17/2019   BILITOT 0.6 08/17/2019     Review of Systems  Constitutional: Negative.  Negative for chills, fatigue, fever and unexpected weight change.  HENT: Negative for congestion, ear discharge, ear pain, rhinorrhea, sinus pressure, sneezing and sore throat.   Eyes: Negative for photophobia, pain, discharge, redness and itching.  Respiratory: Negative for cough, shortness of breath, wheezing and stridor.   Gastrointestinal: Negative for abdominal pain, blood in stool, constipation, diarrhea, nausea and vomiting.  Endocrine: Negative for cold intolerance, heat intolerance, polydipsia, polyphagia and polyuria.  Genitourinary: Negative for dysuria, flank pain, frequency, hematuria, menstrual problem, pelvic pain, urgency, vaginal bleeding and vaginal discharge.  Musculoskeletal: Negative for arthralgias, back pain and myalgias.  Skin: Negative for rash.  Allergic/Immunologic: Negative for environmental allergies and food allergies.  Neurological: Negative for dizziness,  weakness, light-headedness, numbness and headaches.  Hematological: Negative for adenopathy. Does not bruise/bleed easily.  Psychiatric/Behavioral: Negative for dysphoric mood. The patient is not nervous/anxious.     Patient Active Problem List   Diagnosis Date Noted  . H/O total hysterectomy 08/07/2020    Allergies  Allergen Reactions  . Penicillins Nausea Only    Past Surgical History:  Procedure Laterality Date  . ABDOMINAL HYSTERECTOMY     partial  . BREAST CYST ASPIRATION Right    neg  . toe spurs      Social History   Tobacco Use  . Smoking status: Current Every Day Smoker    Types: Cigarettes  . Smokeless tobacco: Never Used  . Tobacco comment: discussed smoking cessation- pt refuses/ has own plan  Vaping Use  . Vaping Use: Never used  Substance Use Topics  . Alcohol use: Yes    Comment: occasionally  . Drug use: Never     Medication list has been reviewed and updated.  Current Meds  Medication Sig  . lisinopril (ZESTRIL) 10 MG tablet Take 10 mg by mouth daily. cardio    PHQ 2/9 Scores 08/07/2020 02/11/2020 02/27/2018  PHQ - 2 Score 0 0 1  PHQ- 9 Score 1 0 4    GAD 7 : Generalized Anxiety Score 08/07/2020 02/11/2020  Nervous, Anxious, on Edge 0 0  Control/stop worrying 2 0  Worry too much - different things 0 0  Trouble relaxing 0 0  Restless 0 0  Easily annoyed or irritable 0 0  Afraid - awful might happen  0 0  Total GAD 7 Score 2 0  Anxiety Difficulty Not difficult at all -    BP Readings from Last 3 Encounters:  08/07/20 (!) 138/92  02/11/20 110/80  08/17/19 115/68    Physical Exam Vitals and nursing note reviewed.  Constitutional:      Appearance: She is well-developed, well-groomed and normal weight.  HENT:     Head: Normocephalic.     Jaw: There is normal jaw occlusion.     Right Ear: Hearing, tympanic membrane, ear canal and external ear normal.     Left Ear: Hearing, tympanic membrane, ear canal and external ear normal.  Eyes:      General: Lids are normal. Lids are everted, no foreign bodies appreciated. Vision grossly intact. Gaze aligned appropriately. No visual field deficit or scleral icterus.       Right eye: No foreign body or hordeolum.        Left eye: No foreign body or hordeolum.     Extraocular Movements: Extraocular movements intact.     Right eye: Normal extraocular motion and no nystagmus.     Left eye: Normal extraocular motion and no nystagmus.     Conjunctiva/sclera: Conjunctivae normal.     Right eye: Right conjunctiva is not injected.     Left eye: Left conjunctiva is not injected.     Pupils: Pupils are equal, round, and reactive to light.     Funduscopic exam:    Right eye: Red reflex present.        Left eye: Red reflex present. Neck:     Thyroid: No thyroid mass, thyromegaly or thyroid tenderness.     Vascular: Normal carotid pulses. No carotid bruit, hepatojugular reflux or JVD.     Trachea: Trachea and phonation normal. No tracheal deviation.  Cardiovascular:     Rate and Rhythm: Normal rate and regular rhythm.     Chest Wall: PMI is not displaced.     Pulses: Normal pulses.          Carotid pulses are 2+ on the right side and 2+ on the left side.      Radial pulses are 2+ on the right side and 2+ on the left side.       Femoral pulses are 2+ on the right side and 2+ on the left side.      Popliteal pulses are 2+ on the right side and 2+ on the left side.       Dorsalis pedis pulses are 2+ on the right side and 2+ on the left side.       Posterior tibial pulses are 2+ on the right side and 2+ on the left side.     Heart sounds: Normal heart sounds, S1 normal and S2 normal. No murmur heard.  No systolic murmur is present.  No diastolic murmur is present. No friction rub. No gallop. No S3 or S4 sounds.   Pulmonary:     Effort: Pulmonary effort is normal. No respiratory distress.     Breath sounds: Normal breath sounds. No transmitted upper airway sounds. No decreased breath sounds,  wheezing, rhonchi or rales.  Chest:  Breasts:     Right: Normal. No swelling, bleeding, inverted nipple, mass, nipple discharge, skin change, tenderness, axillary adenopathy or supraclavicular adenopathy.     Left: Normal. No swelling, bleeding, inverted nipple, mass, nipple discharge, skin change, tenderness, axillary adenopathy or supraclavicular adenopathy.    Abdominal:     General: Bowel sounds are normal.  Palpations: Abdomen is soft. There is no hepatomegaly, splenomegaly or mass.     Tenderness: There is no abdominal tenderness. There is no right CVA tenderness, left CVA tenderness, guarding or rebound.     Hernia: There is no hernia in the umbilical area or ventral area.  Genitourinary:    Rectum: Normal. Guaiac result negative. No mass.  Musculoskeletal:        General: No tenderness. Normal range of motion.     Cervical back: Normal, full passive range of motion without pain, normal range of motion and neck supple.     Thoracic back: Normal.     Lumbar back: Normal.     Right lower leg: No edema.     Left lower leg: No edema.  Lymphadenopathy:     Head:     Right side of head: No submandibular or tonsillar adenopathy.     Left side of head: No submandibular or tonsillar adenopathy.     Cervical: No cervical adenopathy.     Right cervical: No superficial, deep or posterior cervical adenopathy.    Left cervical: No superficial, deep or posterior cervical adenopathy.     Upper Body:     Right upper body: No supraclavicular or axillary adenopathy.     Left upper body: No supraclavicular or axillary adenopathy.  Skin:    General: Skin is warm.     Capillary Refill: Capillary refill takes less than 2 seconds.     Findings: No rash.  Neurological:     Mental Status: She is alert and oriented to person, place, and time.     Cranial Nerves: Cranial nerves are intact. No cranial nerve deficit, dysarthria or facial asymmetry.     Sensory: Sensation is intact. No sensory  deficit.     Motor: Motor function is intact.     Deep Tendon Reflexes: Reflexes are normal and symmetric. Reflexes normal.     Reflex Scores:      Tricep reflexes are 2+ on the right side and 2+ on the left side.      Bicep reflexes are 2+ on the right side and 2+ on the left side.      Brachioradialis reflexes are 2+ on the right side and 2+ on the left side.      Patellar reflexes are 2+ on the right side and 2+ on the left side.      Achilles reflexes are 2+ on the right side and 2+ on the left side. Psychiatric:        Mood and Affect: Mood is not anxious or depressed.        Behavior: Behavior is cooperative.     Wt Readings from Last 3 Encounters:  08/07/20 148 lb (67.1 kg)  02/11/20 151 lb (68.5 kg)  08/17/19 151 lb (68.5 kg)    BP (!) 138/92   Pulse 68   Ht 5\' 5"  (1.651 m)   Wt 148 lb (67.1 kg)   BMI 24.63 kg/m   Assessment and Plan:  Patient's chart was reviewed for previous encounters most recent labs most recent imaging and care everywhere.  1. Annual physical exam KIAJA SHORTY is a 60 y.o. female who presents today for her Complete Annual Exam. She feels well. She reports exercising . She reports she is sleeping well.  There was no subjective/objective concerns noted during history and physical exam.Immunizations are reviewed and recommendations provided.   Age appropriate screening tests are discussed. Counseling given for risk factor reduction interventions.  We will obtain a CBC CMP panel and lipid panel along with point-of-care urinalysis. - CBC with Differential/Platelet - Comprehensive metabolic panel - Lipid Panel With LDL/HDL Ratio - POCT urinalysis dipstick  2. Primary hypertension Chronic.  Probably controlled.  Stable.  Patient did not take her medications prior to arrival.  Blood pressure is slightly elevated would likely be controlled on medication.  We will check a CBC at this time. - CBC with Differential/Platelet  3. Colon cancer  screening Discussion with patient and occult blood was negative for blood. - POCT Occult Blood Stool  4. Cigarette nicotine dependence without complication Patient has been advised of the health risks of smoking and counseled concerning cessation of tobacco products. I spent over 3 minutes for discussion and to answer questions.  5. H/O total hysterectomy Patient has history of hysterectomy and is not necessary to repeat Pap smears.  6. Breast cancer screening by mammogram Breast exam today was normal without palpable mass and we will refer for bilateral mammograms. - MM 3D SCREEN BREAST BILATERAL; Future  7. Gross hematuria Point-of-care urinalysis is positive for blood and we will refer to urology for evaluation since she is a smoker. - Ambulatory referral to Urology

## 2020-08-08 LAB — CBC WITH DIFFERENTIAL/PLATELET
Basophils Absolute: 0 10*3/uL (ref 0.0–0.2)
Basos: 0 %
EOS (ABSOLUTE): 0.2 10*3/uL (ref 0.0–0.4)
Eos: 1 %
Hematocrit: 43.6 % (ref 34.0–46.6)
Hemoglobin: 14.8 g/dL (ref 11.1–15.9)
Immature Grans (Abs): 0 10*3/uL (ref 0.0–0.1)
Immature Granulocytes: 0 %
Lymphocytes Absolute: 4.3 10*3/uL — ABNORMAL HIGH (ref 0.7–3.1)
Lymphs: 38 %
MCH: 28.1 pg (ref 26.6–33.0)
MCHC: 33.9 g/dL (ref 31.5–35.7)
MCV: 83 fL (ref 79–97)
Monocytes Absolute: 1.2 10*3/uL — ABNORMAL HIGH (ref 0.1–0.9)
Monocytes: 11 %
Neutrophils Absolute: 5.3 10*3/uL (ref 1.4–7.0)
Neutrophils: 50 %
Platelets: 290 10*3/uL (ref 150–450)
RBC: 5.26 x10E6/uL (ref 3.77–5.28)
RDW: 15 % (ref 11.7–15.4)
WBC: 11.1 10*3/uL — ABNORMAL HIGH (ref 3.4–10.8)

## 2020-08-08 LAB — LIPID PANEL WITH LDL/HDL RATIO
Cholesterol, Total: 235 mg/dL — ABNORMAL HIGH (ref 100–199)
HDL: 66 mg/dL (ref >39)
LDL Chol Calc (NIH): 154 mg/dL — ABNORMAL HIGH (ref 0–99)
LDL/HDL Ratio: 2.3 ratio (ref 0.0–3.2)
Triglycerides: 88 mg/dL (ref 0–149)
VLDL Cholesterol Cal: 15 mg/dL (ref 5–40)

## 2020-08-08 LAB — COMPREHENSIVE METABOLIC PANEL
ALT: 19 IU/L (ref 0–32)
AST: 21 IU/L (ref 0–40)
Albumin/Globulin Ratio: 1.6 (ref 1.2–2.2)
Albumin: 4.8 g/dL (ref 3.8–4.8)
Alkaline Phosphatase: 146 IU/L — ABNORMAL HIGH (ref 44–121)
BUN/Creatinine Ratio: 13 (ref 12–28)
BUN: 12 mg/dL (ref 8–27)
Bilirubin Total: 0.5 mg/dL (ref 0.0–1.2)
CO2: 22 mmol/L (ref 20–29)
Calcium: 9.8 mg/dL (ref 8.7–10.3)
Chloride: 105 mmol/L (ref 96–106)
Creatinine, Ser: 0.92 mg/dL (ref 0.57–1.00)
Globulin, Total: 3 g/dL (ref 1.5–4.5)
Glucose: 93 mg/dL (ref 65–99)
Potassium: 4.4 mmol/L (ref 3.5–5.2)
Sodium: 142 mmol/L (ref 134–144)
Total Protein: 7.8 g/dL (ref 6.0–8.5)
eGFR: 71 mL/min/{1.73_m2} (ref >59)

## 2020-08-14 ENCOUNTER — Ambulatory Visit: Payer: BC Managed Care – PPO | Admitting: Family Medicine

## 2020-08-14 ENCOUNTER — Other Ambulatory Visit: Payer: Self-pay

## 2020-08-14 ENCOUNTER — Encounter: Payer: Self-pay | Admitting: Family Medicine

## 2020-08-14 VITALS — BP 130/68 | HR 64 | Ht 65.0 in | Wt 145.0 lb

## 2020-08-14 DIAGNOSIS — Z1211 Encounter for screening for malignant neoplasm of colon: Secondary | ICD-10-CM | POA: Diagnosis not present

## 2020-08-14 DIAGNOSIS — F17210 Nicotine dependence, cigarettes, uncomplicated: Secondary | ICD-10-CM

## 2020-08-14 DIAGNOSIS — J0100 Acute maxillary sinusitis, unspecified: Secondary | ICD-10-CM

## 2020-08-14 MED ORDER — AZITHROMYCIN 250 MG PO TABS: ORAL_TABLET | ORAL | 0 refills | Status: DC

## 2020-08-14 NOTE — Progress Notes (Signed)
Date:  08/14/2020   Name:  Jamie Cervantes   DOB:  1960-12-25   MRN:  [id number]   Chief Complaint: Sinusitis (Sinus headaches, facial pressure, drainage, gums hurt. Had fever on Saturday- took Mucinex and it helped. )  Sinusitis This is a chronic problem. The current episode started more than 1 year ago. The problem has been gradually improving since onset. There has been no fever. The pain is moderate. Associated symptoms include chills, congestion, coughing, headaches, sinus pressure and sneezing. Pertinent negatives include no diaphoresis, ear pain, hoarse voice, neck pain, shortness of breath, sore throat or swollen glands. Treatments tried: benedryl. The treatment provided moderate relief.    Lab Results  Component Value Date   CREATININE 0.92 08/07/2020   BUN 12 08/07/2020   NA 142 08/07/2020   K 4.4 08/07/2020   CL 105 08/07/2020   CO2 22 08/07/2020   Lab Results  Component Value Date   CHOL 235 (H) 08/07/2020   HDL 66 08/07/2020   LDLCALC 154 (H) 08/07/2020   TRIG 88 08/07/2020   No results found for: TSH No results found for: HGBA1C Lab Results  Component Value Date   WBC 11.1 (H) 08/07/2020   HGB 14.8 08/07/2020   HCT 43.6 08/07/2020   MCV 83 08/07/2020   PLT 290 08/07/2020   Lab Results  Component Value Date   ALT 19 08/07/2020   AST 21 08/07/2020   ALKPHOS 146 (H) 08/07/2020   BILITOT 0.5 08/07/2020     Review of Systems  Constitutional: Positive for chills. Negative for diaphoresis, fatigue, fever and unexpected weight change.  HENT: Positive for congestion, sinus pressure and sneezing. Negative for ear discharge, ear pain, hoarse voice, rhinorrhea and sore throat.   Eyes: Negative for photophobia, pain, discharge, redness and itching.  Respiratory: Positive for cough. Negative for shortness of breath, wheezing and stridor.   Gastrointestinal: Negative for abdominal pain, blood in stool, constipation, diarrhea, nausea and vomiting.  Endocrine:  Negative for cold intolerance, heat intolerance, polydipsia, polyphagia and polyuria.  Genitourinary: Negative for dysuria, flank pain, frequency, hematuria, menstrual problem, pelvic pain, urgency, vaginal bleeding and vaginal discharge.  Musculoskeletal: Negative for arthralgias, back pain, myalgias and neck pain.  Skin: Negative for rash.  Allergic/Immunologic: Negative for environmental allergies and food allergies.  Neurological: Positive for headaches. Negative for dizziness, weakness, light-headedness and numbness.  Hematological: Negative for adenopathy. Does not bruise/bleed easily.  Psychiatric/Behavioral: Negative for dysphoric mood. The patient is not nervous/anxious.     Patient Active Problem List   Diagnosis Date Noted  . H/O total hysterectomy 08/07/2020    Allergies  Allergen Reactions  . Penicillins Nausea Only    Past Surgical History:  Procedure Laterality Date  . ABDOMINAL HYSTERECTOMY     partial  . BREAST CYST ASPIRATION Right    neg  . toe spurs      Social History   Tobacco Use  . Smoking status: Current Every Day Smoker    Types: Cigarettes  . Smokeless tobacco: Never Used  . Tobacco comment: discussed smoking cessation- pt refuses/ has own plan  Vaping Use  . Vaping Use: Never used  Substance Use Topics  . Alcohol use: Yes    Comment: occasionally  . Drug use: Never     Medication list has been reviewed and updated.  Current Meds  Medication Sig  . lisinopril (ZESTRIL) 10 MG tablet Take 10 mg by mouth daily. cardio    PHQ 2/9 Scores 08/07/2020 02/11/2020  02/27/2018  PHQ - 2 Score 0 0 1  PHQ- 9 Score 1 0 4    GAD 7 : Generalized Anxiety Score 08/07/2020 02/11/2020  Nervous, Anxious, on Edge 0 0  Control/stop worrying 2 0  Worry too much - different things 0 0  Trouble relaxing 0 0  Restless 0 0  Easily annoyed or irritable 0 0  Afraid - awful might happen 0 0  Total GAD 7 Score 2 0  Anxiety Difficulty Not difficult at all -     BP Readings from Last 3 Encounters:  08/14/20 130/68  08/07/20 (!) 138/92  02/11/20 110/80    Physical Exam Vitals and nursing note reviewed.  Constitutional:      Appearance: She is well-developed.  HENT:     Head: Normocephalic.     Right Ear: Hearing, tympanic membrane, ear canal and external ear normal.     Left Ear: Hearing, tympanic membrane, ear canal and external ear normal.     Nose: Congestion and rhinorrhea present.     Right Sinus: No maxillary sinus tenderness or frontal sinus tenderness.     Left Sinus: No maxillary sinus tenderness or frontal sinus tenderness.  Eyes:     General: Lids are everted, no foreign bodies appreciated. No scleral icterus.       Left eye: No foreign body or hordeolum.     Conjunctiva/sclera: Conjunctivae normal.     Right eye: Right conjunctiva is not injected.     Left eye: Left conjunctiva is not injected.     Pupils: Pupils are equal, round, and reactive to light.  Neck:     Thyroid: No thyromegaly.     Vascular: No JVD.     Trachea: No tracheal deviation.  Cardiovascular:     Rate and Rhythm: Normal rate and regular rhythm.     Heart sounds: Normal heart sounds. No murmur heard. No friction rub. No gallop.   Pulmonary:     Effort: Pulmonary effort is normal. No respiratory distress.     Breath sounds: Normal breath sounds. No wheezing or rales.  Abdominal:     General: Bowel sounds are normal.     Palpations: Abdomen is soft. There is no mass.     Tenderness: There is no abdominal tenderness. There is no guarding or rebound.  Musculoskeletal:        General: No tenderness. Normal range of motion.     Cervical back: Normal range of motion and neck supple.  Lymphadenopathy:     Cervical: No cervical adenopathy.  Skin:    General: Skin is warm.     Findings: No rash.  Neurological:     Mental Status: She is alert and oriented to person, place, and time.     Cranial Nerves: No cranial nerve deficit.     Deep Tendon  Reflexes: Reflexes normal.  Psychiatric:        Mood and Affect: Mood is not anxious or depressed.     Wt Readings from Last 3 Encounters:  08/14/20 145 lb (65.8 kg)  08/07/20 148 lb (67.1 kg)  02/11/20 151 lb (68.5 kg)    BP 130/68   Pulse 64   Ht 5\' 5"  (1.651 m)   Wt 145 lb (65.8 kg)   BMI 24.13 kg/m   Assessment and Plan: 1. Acute maxillary sinusitis, recurrence not specified Acute.  Persistent.  Stable.  We will initiate a azithromycin 250 mg 2 today followed by 1 a day for 4 days.  Patient is also been instructed to use nasal saline/Nettie pot.  Patient is also suggested using some Mucinex and some Sudafed 30 mg 1 twice a day for decongestant and mucolytic agent.  Patient has been given a note for work tonight so she can initiate medications.  2. Cigarette nicotine dependence without complication Patient has been advised of the health risks of smoking and counseled concerning cessation of tobacco products. I spent over 3 minutes for discussion and to answer questions.  3. Colon cancer screening Patient has history of smoking and we will refer to gastroenterology for evaluation and possible colonoscopy. - Ambulatory referral to Gastroenterology

## 2020-08-18 ENCOUNTER — Other Ambulatory Visit: Payer: Self-pay | Admitting: *Deleted

## 2020-08-18 ENCOUNTER — Encounter: Payer: Self-pay | Admitting: Urology

## 2020-08-18 ENCOUNTER — Other Ambulatory Visit: Payer: Self-pay

## 2020-08-18 ENCOUNTER — Other Ambulatory Visit
Admission: RE | Admit: 2020-08-18 | Discharge: 2020-08-18 | Disposition: A | Discharge status: Home / Self Care | Payer: BC Managed Care – PPO | Attending: Urology | Admitting: Urology

## 2020-08-18 ENCOUNTER — Ambulatory Visit: Payer: BC Managed Care – PPO | Admitting: Urology

## 2020-08-18 VITALS — BP 154/82 | HR 59 | Ht 66.0 in | Wt 148.0 lb

## 2020-08-18 DIAGNOSIS — R310 Gross hematuria: Secondary | ICD-10-CM | POA: Diagnosis not present

## 2020-08-18 DIAGNOSIS — R8290 Unspecified abnormal findings in urine: Secondary | ICD-10-CM

## 2020-08-18 LAB — URINALYSIS, COMPLETE (UACMP) WITH MICROSCOPIC
Bilirubin Urine: NEGATIVE
Glucose, UA: NEGATIVE mg/dL
Ketones, ur: NEGATIVE mg/dL
Leukocytes,Ua: NEGATIVE
Nitrite: NEGATIVE
Protein, ur: NEGATIVE mg/dL
Specific Gravity, Urine: 1.01 (ref 1.005–1.030)
pH: 5.5 (ref 5.0–8.0)

## 2020-08-18 NOTE — Progress Notes (Signed)
08/18/2020 9:19 AM   Jamie Cervantes 1960-06-03 [id number]  Referring provider: Juline Patch, MD [address] [place],  [place] [address]  Chief Complaint  Patient presents with  . New Patient (Initial Visit)    hematuria    HPI: 60 year old female who presents today for further evaluation of possible microscopic hematuria.  She reports that at her primary care visit, she had routine labs.  On a point-of-care urinalysis dip, she had 3+ blood.  There is no microscopic done.  She is a smoker.  She has cut back over the years now smokes about a pack every 3 to 4 days.  She denies any urinary symptoms today.  No incontinence.  No gross hematuria or dysuria.  No issues with infections.  No history of kidney stones or flank pain.   PMH: Past Medical History:  Diagnosis Date  . Hypertension     Surgical History: Past Surgical History:  Procedure Laterality Date  . ABDOMINAL HYSTERECTOMY     partial  . BREAST CYST ASPIRATION Right    neg  . toe spurs      Home Medications:  Allergies as of 08/18/2020      Reactions   Penicillins Nausea Only      Medication List       Accurate as of August 18, 2020  9:19 AM. If you have any questions, ask your nurse or doctor.        STOP taking these medications   azithromycin 250 MG tablet Commonly known as: ZITHROMAX Stopped by: Hollice Espy, MD   hydrochlorothiazide 25 MG tablet Commonly known as: HYDRODIURIL Stopped by: Hollice Espy, MD     TAKE these medications   lisinopril 10 MG tablet Commonly known as: ZESTRIL Take 10 mg by mouth daily. cardio       Allergies:  Allergies  Allergen Reactions  . Penicillins Nausea Only    Family History: Family History  Problem Relation Age of Onset  . Heart disease Mother   . Hypertension Mother   . Breast cancer Neg Hx     Social History:  reports that she has been smoking cigarettes. She has never used smokeless tobacco. She reports current  alcohol use. She reports that she does not use drugs.   Physical Exam: BP (!) 154/82   Pulse (!) 59   Ht 5\' 6"  (1.676 m)   Wt 148 lb (67.1 kg)   BMI 23.89 kg/m   Constitutional:  Alert and oriented, No acute distress. HEENT: [place] AT, moist mucus membranes.  Trachea midline, no masses. Cardiovascular: No clubbing, cyanosis, or edema. Respiratory: Normal respiratory effort, no increased work of breathing.. Skin: No rashes, bruises or suspicious lesions. Neurologic: Grossly intact, no focal deficits, moving all 4 extremities. Psychiatric: Normal mood and affect.  Laboratory Data: Lab Results  Component Value Date   WBC 11.1 (H) 08/07/2020   HGB 14.8 08/07/2020   HCT 43.6 08/07/2020   MCV 83 08/07/2020   PLT 290 08/07/2020    Lab Results  Component Value Date   CREATININE 0.92 08/07/2020    Urinalysis Results for orders placed or performed during the hospital encounter of 08/18/20  Urinalysis, Complete w Microscopic  Result Value Ref Range   Color, Urine YELLOW YELLOW   APPearance CLEAR CLEAR   Specific Gravity, Urine 1.010 1.005 - 1.030   pH 5.5 5.0 - 8.0   Glucose, UA NEGATIVE NEGATIVE mg/dL   Hgb urine dipstick SMALL (A) NEGATIVE   Bilirubin  Urine NEGATIVE NEGATIVE   Ketones, ur NEGATIVE NEGATIVE mg/dL   Protein, ur NEGATIVE NEGATIVE mg/dL   Nitrite NEGATIVE NEGATIVE   Leukocytes,Ua NEGATIVE NEGATIVE   Squamous Epithelial / LPF 6-10 0 - 5   WBC, UA 0-5 0 - 5 WBC/hpf   RBC / HPF 0-5 0 - 5 RBC/hpf   Bacteria, UA MANY (A) NONE SEEN   Previous urinalysis from PCP (08/07/20)  was also reviewed, 3+ blood on dip but no microscopic exam.  Urine was otherwise unremarkable.  Assessment & Plan:    1. Abnormal urinalysis No evidence of microscopic blood today  We discussed the difference between urinary dipstick and microscopic urinalysis as it relates to blood in the urine.  We discussed that it a dipstick is a chemical reaction that can have false positives.    We  discussed the guidelines for evaluation of microscopic hematuria as per the American urologic Association.  The patient does have 3 or more red blood cells per high-powered field on microscopic exam to initiate work-up which may include renal ultrasound versus CT urogram along with cystoscopy depending on the risk category.  At this point in time, she does not meet the criteria for microscopic hematuria evaluation.  If she did meet the criteria, she would be considered high risk given her history of smoking and age.  She understands all of this.  I will have her follow-up with her PCP and recommended urinalysis with microscopic exam to be checked in the future.  If she does have true microscopic hematuria, we will expedite her evaluation.  We also discussed the importance of smoking cessation and if she does have any gross hematuria, advised to return immediately.   Hollice Espy, MD  [facility] [address], [place], [place] [address] [phone]

## 2020-08-23 ENCOUNTER — Inpatient Hospital Stay: Admission: RE | Admit: 2020-08-23 | Payer: BC Managed Care – PPO | Source: Ambulatory Visit

## 2020-08-24 ENCOUNTER — Encounter: Payer: Self-pay | Admitting: *Deleted

## 2020-10-10 ENCOUNTER — Telehealth (INDEPENDENT_AMBULATORY_CARE_PROVIDER_SITE_OTHER): Payer: BC Managed Care – PPO | Admitting: Gastroenterology

## 2020-10-10 DIAGNOSIS — Z1211 Encounter for screening for malignant neoplasm of colon: Secondary | ICD-10-CM

## 2020-10-10 MED ORDER — CLENPIQ 10-3.5-12 MG-GM -GM/160ML PO SOLN: 1.0000 | Freq: Once | ORAL | 0 refills | Status: AC

## 2020-10-10 NOTE — Progress Notes (Signed)
Gastroenterology Pre-Procedure Review  Request Date: 10/30/20 Requesting Physician: Dr. Allen Norris  PATIENT REVIEW QUESTIONS: The patient responded to the following health history questions as indicated:    1. Are you having any GI issues? no 2. Do you have a personal history of Polyps? no 3. Do you have a family history of Colon Cancer or Polyps? no 4. Diabetes Mellitus? no 5. Joint replacements in the past 12 months?no 6. Major health problems in the past 3 months?no 7. Any artificial heart valves, MVP, or defibrillator?no    MEDICATIONS & ALLERGIES:    Patient reports the following regarding taking any anticoagulation/antiplatelet therapy:   Plavix, Coumadin, Eliquis, Xarelto, Lovenox, Pradaxa, Brilinta, or Effient? no Aspirin? no  Patient confirms/reports the following medications:  Current Outpatient Medications  Medication Sig Dispense Refill   lisinopril (ZESTRIL) 10 MG tablet Take 10 mg by mouth daily. cardio     No current facility-administered medications for this visit.    Patient confirms/reports the following allergies:  Allergies  Allergen Reactions   Penicillins Nausea Only    No orders of the defined types were placed in this encounter.   AUTHORIZATION INFORMATION Primary Insurance: 1D#: Group #:  Secondary Insurance: 1D#: Group #:  SCHEDULE INFORMATION: Date: 10/30/20 Time: Location: [place]

## 2020-10-12 ENCOUNTER — Encounter: Payer: Self-pay | Admitting: Gastroenterology

## 2020-10-17 NOTE — Anesthesia Preprocedure Evaluation (Addendum)
Anesthesia Evaluation  Patient identified by MRN, date of birth, ID band Patient awake    Reviewed: Allergy & Precautions, NPO status , Patient's Chart, lab work & pertinent test results  History of Anesthesia Complications Negative for: history of anesthetic complications  Airway Mallampati: I   Neck ROM: Full    Dental  (+) Edentulous Upper, Edentulous Lower   Pulmonary Current Smoker (1/2 ppd)Patient did not abstain from smoking.,    Pulmonary exam normal breath sounds clear to auscultation       Cardiovascular hypertension, Normal cardiovascular exam Rhythm:Regular Rate:Normal     Neuro/Psych negative neurological ROS     GI/Hepatic negative GI ROS,   Endo/Other  negative endocrine ROS  Renal/GU negative Renal ROS     Musculoskeletal   Abdominal   Peds  Hematology negative hematology ROS (+)   Anesthesia Other Findings   Reproductive/Obstetrics                            Anesthesia Physical Anesthesia Plan  ASA: 2  Anesthesia Plan: General   Post-op Pain Management:    Induction: Intravenous  PONV Risk Score and Plan: 2 and Propofol infusion, TIVA and Treatment may vary due to age or medical condition  Airway Management Planned: Natural Airway  Additional Equipment:   Intra-op Plan:   Post-operative Plan:   Informed Consent: I have reviewed the patients History and Physical, chart, labs and discussed the procedure including the risks, benefits and alternatives for the proposed anesthesia with the patient or authorized representative who has indicated his/her understanding and acceptance.       Plan Discussed with: CRNA  Anesthesia Plan Comments:        Anesthesia Quick Evaluation

## 2020-10-18 ENCOUNTER — Telehealth: Payer: Self-pay | Admitting: Family Medicine

## 2020-10-18 NOTE — Telephone Encounter (Signed)
  Notes to clinic:  medication filled by a historical provider  Review for refill   Requested Prescriptions  Pending Prescriptions Disp Refills   lisinopril (ZESTRIL) 10 MG tablet      Sig: Take 1 tablet (10 mg total) by mouth daily. cardio      Cardiovascular:  ACE Inhibitors Failed - 10/18/2020 10:11 AM      Failed - Last BP in normal range    BP Readings from Last 1 Encounters:  08/18/20 (!) 154/82          Passed - Cr in normal range and within 180 days    Creatinine, Ser  Date Value Ref Range Status  08/07/2020 0.92 0.57 - 1.00 mg/dL Final          Passed - K in normal range and within 180 days    Potassium  Date Value Ref Range Status  08/07/2020 4.4 3.5 - 5.2 mmol/L Final          Passed - Patient is not pregnant      Passed - Valid encounter within last 6 months    Recent Outpatient Visits           2 months ago Acute maxillary sinusitis, recurrence not specified   [facility] Juline Patch, MD   2 months ago Annual physical exam   [facility] Juline Patch, MD   8 months ago [facility] Juline Patch, MD   1 year ago Annual physical exam   [facility] Juline Patch, MD   2 years ago Acute maxillary sinusitis, recurrence not specified   [facility] Juline Patch, MD       Future Appointments             In 9 months Juline Patch, MD [facility]

## 2020-10-18 NOTE — Telephone Encounter (Signed)
Medication Refill - Medication: lisinopril (ZESTRIL) 10 MG tablet   Has the patient contacted their pharmacy? y (Agent: If no, request that the patient contact the pharmacy for the refill.) t: If yes, when and what did the pharmacy advise?)contact pcp  Preferred Pharmacy (with phone number or street name):  [id number] - [place], [place] Phone:  [phone]  Fax:  [phone]      Agent: Please be advised that RX refills may take up to 3 business days. We ask that you follow-up with your pharmacy.

## 2020-10-30 ENCOUNTER — Other Ambulatory Visit: Payer: Self-pay

## 2020-10-30 ENCOUNTER — Encounter
Admission: RE | Disposition: A | Discharge status: Home / Self Care | Payer: Self-pay | Source: Home / Self Care | Attending: Gastroenterology

## 2020-10-30 ENCOUNTER — Ambulatory Visit: Payer: BC Managed Care – PPO | Admitting: Anesthesiology

## 2020-10-30 ENCOUNTER — Ambulatory Visit
Admission: RE | Admit: 2020-10-30 | Discharge: 2020-10-30 | Disposition: A | Discharge status: Home / Self Care | Payer: BC Managed Care – PPO | Attending: Gastroenterology | Admitting: Gastroenterology

## 2020-10-30 ENCOUNTER — Telehealth: Payer: Self-pay

## 2020-10-30 ENCOUNTER — Encounter: Payer: Self-pay | Admitting: Gastroenterology

## 2020-10-30 DIAGNOSIS — Z1211 Encounter for screening for malignant neoplasm of colon: Secondary | ICD-10-CM

## 2020-10-30 DIAGNOSIS — F17210 Nicotine dependence, cigarettes, uncomplicated: Secondary | ICD-10-CM | POA: Insufficient documentation

## 2020-10-30 DIAGNOSIS — K635 Polyp of colon: Secondary | ICD-10-CM

## 2020-10-30 DIAGNOSIS — Z79899 Other long term (current) drug therapy: Secondary | ICD-10-CM | POA: Insufficient documentation

## 2020-10-30 DIAGNOSIS — Z880 Allergy status to penicillin: Secondary | ICD-10-CM | POA: Diagnosis not present

## 2020-10-30 DIAGNOSIS — K640 First degree hemorrhoids: Secondary | ICD-10-CM | POA: Diagnosis not present

## 2020-10-30 DIAGNOSIS — D125 Benign neoplasm of sigmoid colon: Secondary | ICD-10-CM | POA: Insufficient documentation

## 2020-10-30 HISTORY — DX: Presence of dental prosthetic device (complete) (partial): Z97.2

## 2020-10-30 HISTORY — PX: COLONOSCOPY WITH PROPOFOL: SHX5780

## 2020-10-30 HISTORY — DX: Other complications of anesthesia, initial encounter: T88.59XA

## 2020-10-30 HISTORY — PX: POLYPECTOMY: SHX5525

## 2020-10-30 SURGERY — COLONOSCOPY WITH PROPOFOL
Anesthesia: General | Site: Rectum

## 2020-10-30 MED ORDER — ACETAMINOPHEN 325 MG PO TABS: 650.0000 mg | ORAL_TABLET | Freq: Once | ORAL | Status: DC | PRN

## 2020-10-30 MED ORDER — LACTATED RINGERS IV SOLN: INTRAVENOUS | Status: DC

## 2020-10-30 MED ORDER — ONDANSETRON HCL 4 MG/2ML IJ SOLN: 4.0000 mg | Freq: Once | INTRAMUSCULAR | Status: DC | PRN

## 2020-10-30 MED ORDER — ACETAMINOPHEN 160 MG/5ML PO SOLN: 325.0000 mg | ORAL | Status: DC | PRN

## 2020-10-30 MED ORDER — STERILE WATER FOR IRRIGATION IR SOLN
Status: DC | PRN
  Administered 2020-10-30: 100 mL

## 2020-10-30 MED ORDER — PROPOFOL 10 MG/ML IV BOLUS
INTRAVENOUS | Status: DC | PRN
  Administered 2020-10-30 (×2): 40 mg via INTRAVENOUS
  Administered 2020-10-30: 50 mg via INTRAVENOUS
  Administered 2020-10-30: 70 mg via INTRAVENOUS
  Administered 2020-10-30: 40 mg via INTRAVENOUS

## 2020-10-30 MED ORDER — SODIUM CHLORIDE 0.9 % IV SOLN
INTRAVENOUS | Status: DC
Start: 2020-10-30 — End: 2020-10-30

## 2020-10-30 SURGICAL SUPPLY — 24 items
CLIP HMST 235XBRD CATH ROT (MISCELLANEOUS) ×2 IMPLANT
CLIP RESOLUTION 360 11X235 (MISCELLANEOUS) ×4
ELECT REM PT RETURN 9FT ADLT (ELECTROSURGICAL)
ELECTRODE REM PT RTRN 9FT ADLT (ELECTROSURGICAL) IMPLANT
ELEVIEW  INJECTABLE COMP 10 (MISCELLANEOUS) ×2
FORCEPS BIOP RAD 4 LRG CAP 4 (CUTTING FORCEPS) ×2 IMPLANT
GOWN CVR UNV OPN BCK APRN NK (MISCELLANEOUS) ×2 IMPLANT
GOWN ISOL THUMB LOOP REG UNIV (MISCELLANEOUS) ×4
INJECTABLE ELEVIEW COMP 10 (MISCELLANEOUS) ×2 IMPLANT
INJECTOR VARIJECT VIN23 (MISCELLANEOUS) ×1 IMPLANT
KIT DEFENDO VALVE AND CONN (KITS) IMPLANT
KIT PRC NS LF DISP ENDO (KITS) ×1 IMPLANT
KIT PROCEDURE OLYMPUS (KITS) ×2
MANIFOLD NEPTUNE II (INSTRUMENTS) ×2 IMPLANT
MARKER SPOT ENDO TATTOO 5ML (MISCELLANEOUS) IMPLANT
PROBE APC STR FIRE (PROBE) IMPLANT
RETRIEVER NET ROTH 2.5X230 LF (MISCELLANEOUS) IMPLANT
SNARE COLD EXACTO (MISCELLANEOUS) ×2 IMPLANT
SNARE SHORT THROW 13M SML OVAL (MISCELLANEOUS) IMPLANT
SNARE SNG USE RND 15MM (INSTRUMENTS) IMPLANT
SPOT EX ENDOSCOPIC TATTOO (MISCELLANEOUS)
TRAP ETRAP POLY (MISCELLANEOUS) ×2 IMPLANT
VARIJECT INJECTOR VIN23 (MISCELLANEOUS) ×2
WATER STERILE IRR 250ML POUR (IV SOLUTION) ×2 IMPLANT

## 2020-10-30 NOTE — Anesthesia Postprocedure Evaluation (Signed)
Anesthesia Post Note  Patient: Jamie Cervantes  Procedure(s) Performed: COLONOSCOPY WITH PROPOFOL (Rectum) POLYPECTOMY (Rectum)     Patient location during evaluation: PACU Anesthesia Type: General Level of consciousness: awake and alert, oriented and patient cooperative Pain management: pain level controlled Vital Signs Assessment: post-procedure vital signs reviewed and stable Respiratory status: spontaneous breathing, nonlabored ventilation and respiratory function stable Cardiovascular status: blood pressure returned to baseline and stable Postop Assessment: adequate PO intake Anesthetic complications: no   No notable events documented.  Darrin Nipper

## 2020-10-30 NOTE — Telephone Encounter (Signed)
Copied from [place] [id number]. Topic: General - Call Back - No Documentation >> Oct 30, 2020  3:46 PM Erick Blinks wrote: Reason for CRM: Pt returned missed call from office, is requesting to speak to [facility]. Please advise  Best contact: [phone]

## 2020-10-30 NOTE — Telephone Encounter (Signed)
Pt wants a FU call as does not understand why this is not being refilled. States Dr Gayla Medicus has given to her in the past.  FU to advise [phone]

## 2020-10-30 NOTE — Transfer of Care (Signed)
Immediate Anesthesia Transfer of Care Note  Patient: Jamie Cervantes  Procedure(s) Performed: COLONOSCOPY WITH PROPOFOL (Rectum) POLYPECTOMY (Rectum)  Patient Location: PACU  Anesthesia Type: General  Level of Consciousness: awake, alert  and patient cooperative  Airway and Oxygen Therapy: Patient Spontanous Breathing and Patient connected to supplemental oxygen  Post-op Assessment: Post-op Vital signs reviewed, Patient's Cardiovascular Status Stable, Respiratory Function Stable, Patent Airway and No signs of Nausea or vomiting  Post-op Vital Signs: Reviewed and stable  Complications: No notable events documented.

## 2020-10-30 NOTE — Op Note (Signed)
[facility] Gastroenterology Patient Name: Jamie Cervantes Procedure Date: 10/30/2020 9:59 AM MRN: [id number] Account #: [id number] Date of Birth: 08/14/1960 Admit Type: Outpatient Age: 60 Room: [facility] OR ROOM 01 Gender: Female Note Status: Finalized Procedure:             Colonoscopy Indications:           Screening for colorectal malignant neoplasm Providers:             Lucilla Lame MD, MD Referring MD:          Juline Patch, MD (Referring MD) Medicines:             Propofol per Anesthesia Complications:         No immediate complications. Procedure:             Pre-Anesthesia Assessment:                        - Prior to the procedure, a History and Physical was                         performed, and patient medications and allergies were                         reviewed. The patient's tolerance of previous                         anesthesia was also reviewed. The risks and benefits                         of the procedure and the sedation options and risks                         were discussed with the patient. All questions were                         answered, and informed consent was obtained. Prior                         Anticoagulants: The patient has taken no previous                         anticoagulant or antiplatelet agents. ASA Grade                         Assessment: II - A patient with mild systemic disease.                         After reviewing the risks and benefits, the patient                         was deemed in satisfactory condition to undergo the                         procedure.                        After obtaining informed consent, the colonoscope was  passed under direct vision. Throughout the procedure,                         the patient's blood pressure, pulse, and oxygen                         saturations were monitored continuously. The was                         introduced through the anus and  advanced to the the                         cecum, identified by appendiceal orifice and ileocecal                         valve. The colonoscopy was performed without                         difficulty. The patient tolerated the procedure well.                         The quality of the bowel preparation was excellent. Findings:      The perianal and digital rectal examinations were normal.      A 6 mm polyp was found in the cecum. The polyp was pedunculated.       Preparations were made for mucosal resection. Chromoscopy with indigo       carmine was done to mark the borders of the lesion. Saline with indigo       carmine was injected to raise the lesion. Snare mucosal resection was       performed. Resection and retrieval were complete. To prevent bleeding       post-intervention, two hemostatic clips were successfully placed (MR       conditional). There was no bleeding at the end of the procedure.      Two sessile polyps were found in the sigmoid colon. The polyps were 2 to       5 mm in size. These polyps were removed with a cold snare. Resection and       retrieval were complete.      Non-bleeding internal hemorrhoids were found during retroflexion. The       hemorrhoids were Grade I (internal hemorrhoids that do not prolapse). Impression:            - One 6 mm polyp in the cecum, removed with mucosal                         resection. Resected and retrieved. Clips (MR                         conditional) were placed.                        - Two 2 to 5 mm polyps in the sigmoid colon, removed                         with a cold snare. Resected and retrieved.                        -  Non-bleeding internal hemorrhoids.                        - Mucosal resection was performed. Resection and                         retrieval were complete. Recommendation:        - Discharge patient to home.                        - Resume previous diet.                        - Continue present  medications.                        - Await pathology results.                        - Await pathology results. Procedure Code(s):     --- Professional ---                        [id number], Colonoscopy, flexible; with endoscopic mucosal                         resection                        45385, 47, Colonoscopy, flexible; with removal of                         tumor(s), polyp(s), or other lesion(s) by snare                         technique Diagnosis Code(s):     --- Professional ---                        Z12.11, Encounter for screening for malignant neoplasm                         of colon                        K63.5, Polyp of colon CPT copyright 2019 American Medical Association. All rights reserved. The codes documented in this report are preliminary and upon coder review may  be revised to meet current compliance requirements. Lucilla Lame MD, MD 10/30/2020 10:31:56 AM This report has been signed electronically. Number of Addenda: 0 Note Initiated On: 10/30/2020 9:59 AM Scope Withdrawal Time: 0 hours 14 minutes 14 seconds  Total Procedure Duration: 0 hours 19 minutes 48 seconds  Estimated Blood Loss:  Estimated blood loss: none.      [facility]

## 2020-10-30 NOTE — H&P (Signed)
   Lucilla Lame, MD [address] [place], [place] [address] Phone: [phone] Fax : [phone]  Primary Care Physician:  Juline Patch, MD Primary Gastroenterologist:  Dr. Allen Norris  Pre-Procedure History & Physical: HPI:  Jamie Cervantes is a 60 y.o. female is here for a screening colonoscopy.   Past Medical History:  Diagnosis Date   Complication of anesthesia    Hypertension    Wears dentures     Past Surgical History:  Procedure Laterality Date   ABDOMINAL HYSTERECTOMY     partial   BREAST CYST ASPIRATION Right    neg   toe spurs      Prior to Admission medications   Medication Sig Start Date End Date Taking? Authorizing Provider  lisinopril (ZESTRIL) 10 MG tablet Take 10 mg by mouth daily. cardio 07/06/19  Yes [provider]    Allergies as of 10/10/2020 - Review Complete 10/10/2020  Allergen Reaction Noted   Penicillins Nausea Only 02/27/2018    Family History  Problem Relation Age of Onset   Heart disease Mother    Hypertension Mother    Breast cancer Neg Hx     Social History   Socioeconomic History   Marital status: Single    Spouse name: Not on file   Number of children: Not on file   Years of education: Not on file   Highest education level: Not on file  Occupational History   Not on file  Tobacco Use   Smoking status: Every Day    Pack years: 0.00    Types: Cigarettes   Smokeless tobacco: Never   Tobacco comments:    discussed smoking cessation- pt refuses/ has own plan  Vaping Use   Vaping Use: Never used  Substance and Sexual Activity   Alcohol use: Yes    Comment: occasionally   Drug use: Never   Sexual activity: Yes  Other Topics Concern   Not on file  Social History Narrative   Not on file   Social Determinants of Health   Financial Resource Strain: Not on file  Food Insecurity: Not on file  Transportation Needs: Not on file  Physical Activity: Not on file  Stress: Not on file  Social  Connections: Not on file  Intimate Partner Violence: Not on file    Review of Systems: See HPI, otherwise negative ROS  Physical Exam: BP (!) 176/97   Pulse 92   Temp (!) 97.1 F (36.2 C) (Temporal)   Resp 20   Ht 5\' 6"  (1.676 m)   Wt 66.1 kg   SpO2 99%   BMI 23.52 kg/m  General:   Alert,  pleasant and cooperative in NAD Head:  Normocephalic and atraumatic. Neck:  Supple; no masses or thyromegaly. Lungs:  Clear throughout to auscultation.    Heart:  Regular rate and rhythm. Abdomen:  Soft, nontender and nondistended. Normal bowel sounds, without guarding, and without rebound.   Neurologic:  Alert and  oriented x4;  grossly normal neurologically.  Impression/Plan: Jamie Cervantes is now here to undergo a screening colonoscopy.  Risks, benefits, and alternatives regarding colonoscopy have been reviewed with the patient.  Questions have been answered.  All parties agreeable.

## 2020-10-31 LAB — SURGICAL PATHOLOGY

## 2020-11-01 ENCOUNTER — Encounter: Payer: Self-pay | Admitting: Gastroenterology

## 2020-12-15 ENCOUNTER — Telehealth: Payer: Self-pay

## 2020-12-15 NOTE — Telephone Encounter (Signed)
Called and spoke with pt to remind her to schedule her mammo appt. She said she is not able to write down the scheduling # at this time, but she will call back Monday to get the #.  If patient returns call, please give her [phone] to schedule her mammogram.

## 2021-05-24 ENCOUNTER — Ambulatory Visit (INDEPENDENT_AMBULATORY_CARE_PROVIDER_SITE_OTHER): Payer: BC Managed Care – PPO | Admitting: Family Medicine

## 2021-05-24 ENCOUNTER — Other Ambulatory Visit: Payer: Self-pay

## 2021-05-24 ENCOUNTER — Encounter: Payer: Self-pay | Admitting: Family Medicine

## 2021-05-24 VITALS — BP 158/100 | HR 84 | Ht 66.0 in | Wt 150.0 lb

## 2021-05-24 DIAGNOSIS — J0100 Acute maxillary sinusitis, unspecified: Secondary | ICD-10-CM

## 2021-05-24 DIAGNOSIS — I10 Essential (primary) hypertension: Secondary | ICD-10-CM

## 2021-05-24 DIAGNOSIS — F17210 Nicotine dependence, cigarettes, uncomplicated: Secondary | ICD-10-CM

## 2021-05-24 DIAGNOSIS — R040 Epistaxis: Secondary | ICD-10-CM | POA: Diagnosis not present

## 2021-05-24 MED ORDER — HYDROCHLOROTHIAZIDE 12.5 MG PO TABS: 12.5000 mg | ORAL_TABLET | Freq: Every day | ORAL | 3 refills | Status: DC

## 2021-05-24 MED ORDER — AZITHROMYCIN 250 MG PO TABS: ORAL_TABLET | ORAL | 0 refills | Status: AC

## 2021-05-24 NOTE — Progress Notes (Signed)
Date:  05/24/2021   Name:  Jamie Cervantes   DOB:  May 15, 1960   MRN:  [id number]   Chief Complaint: Nasal Congestion (Having nose bleeds occasionally)  Sinusitis This is a new problem. The current episode started 1 to 4 weeks ago (3weeks). The problem has been waxing and waning since onset. There has been no fever. The pain is moderate. Associated symptoms include congestion and sinus pressure. Pertinent negatives include no chills, coughing, diaphoresis, ear pain, headaches, hoarse voice, neck pain, shortness of breath, sneezing, sore throat or swollen glands. Treatments tried: nettie pot/mucinex. The treatment provided moderate relief.   Lab Results  Component Value Date   NA 142 08/07/2020   K 4.4 08/07/2020   CO2 22 08/07/2020   GLUCOSE 93 08/07/2020   BUN 12 08/07/2020   CREATININE 0.92 08/07/2020   CALCIUM 9.8 08/07/2020   EGFR 71 08/07/2020   GFRNONAA 56 (L) 08/17/2019   Lab Results  Component Value Date   CHOL 235 (H) 08/07/2020   HDL 66 08/07/2020   LDLCALC 154 (H) 08/07/2020   TRIG 88 08/07/2020   No results found for: TSH No results found for: HGBA1C Lab Results  Component Value Date   WBC 11.1 (H) 08/07/2020   HGB 14.8 08/07/2020   HCT 43.6 08/07/2020   MCV 83 08/07/2020   PLT 290 08/07/2020   Lab Results  Component Value Date   ALT 19 08/07/2020   AST 21 08/07/2020   ALKPHOS 146 (H) 08/07/2020   BILITOT 0.5 08/07/2020   No results found for: 25OHVITD2, 25OHVITD3, VD25OH   Review of Systems  Constitutional:  Negative for chills, diaphoresis and fever.  HENT:  Positive for congestion and sinus pressure. Negative for drooling, ear discharge, ear pain, hoarse voice, sneezing and sore throat.   Respiratory:  Negative for cough, shortness of breath and wheezing.   Cardiovascular:  Negative for chest pain, palpitations and leg swelling.  Gastrointestinal:  Negative for abdominal pain, blood in stool, constipation, diarrhea and nausea.  Endocrine: Negative  for polydipsia.  Genitourinary:  Negative for dysuria, frequency, hematuria and urgency.  Musculoskeletal:  Negative for back pain, myalgias and neck pain.  Skin:  Negative for rash.  Allergic/Immunologic: Negative for environmental allergies.  Neurological:  Negative for dizziness and headaches.  Hematological:  Does not bruise/bleed easily.  Psychiatric/Behavioral:  Negative for suicidal ideas. The patient is not nervous/anxious.    Patient Active Problem List   Diagnosis Date Noted   Colon cancer screening    Polyp of colon    H/O total hysterectomy 08/07/2020    Allergies  Allergen Reactions   Penicillins Nausea Only    Past Surgical History:  Procedure Laterality Date   ABDOMINAL HYSTERECTOMY     partial   BREAST CYST ASPIRATION Right    neg   COLONOSCOPY WITH PROPOFOL N/A 10/30/2020   Procedure: COLONOSCOPY WITH PROPOFOL;  Surgeon: Lucilla Lame, MD;  Location: [facility];  Service: Endoscopy;  Laterality: N/A;  1 ML ELEVIEW INJECTED IN APPENDIX/ CECUM AREA @ 1021   POLYPECTOMY N/A 10/30/2020   Procedure: POLYPECTOMY;  Surgeon: Lucilla Lame, MD;  Location: [facility];  Service: Endoscopy;  Laterality: N/A;   toe spurs      Social History   Tobacco Use   Smoking status: Every Day    Types: Cigarettes   Smokeless tobacco: Never   Tobacco comments:    discussed smoking cessation- pt refuses/ has own plan  Vaping Use   Vaping  Use: Never used  Substance Use Topics   Alcohol use: Yes    Comment: occasionally   Drug use: Never     Medication list has been reviewed and updated.  Current Meds  Medication Sig   lisinopril (ZESTRIL) 10 MG tablet Take 10 mg by mouth daily. cardio    PHQ 2/9 Scores 05/24/2021 08/07/2020 02/11/2020 02/27/2018  PHQ - 2 Score 0 0 0 1  PHQ- 9 Score 0 1 0 4    GAD 7 : Generalized Anxiety Score 05/24/2021 08/07/2020 02/11/2020  Nervous, Anxious, on Edge 0 0 0  Control/stop worrying 0 2 0  Worry too much - different things  0 0 0  Trouble relaxing 0 0 0  Restless 0 0 0  Easily annoyed or irritable 0 0 0  Afraid - awful might happen 0 0 0  Total GAD 7 Score 0 2 0  Anxiety Difficulty Not difficult at all Not difficult at all -    BP Readings from Last 3 Encounters:  05/24/21 (!) 160/100  10/30/20 105/62  08/18/20 (!) 154/82    Physical Exam Vitals and nursing note reviewed.  Constitutional:      Appearance: She is well-developed.  HENT:     Head: Normocephalic.     Right Ear: Hearing, tympanic membrane, ear canal and external ear normal.     Left Ear: Hearing, tympanic membrane, ear canal and external ear normal.     Nose: Nose normal.     Right Nostril: No epistaxis.     Left Nostril: No epistaxis.     Right Turbinates: Not enlarged or swollen.     Left Turbinates: Not enlarged or swollen.     Right Sinus: No frontal sinus tenderness.     Left Sinus: No frontal sinus tenderness.     Mouth/Throat:     Mouth: Mucous membranes are moist.  Eyes:     General: Lids are everted, no foreign bodies appreciated. No scleral icterus.       Left eye: No foreign body or hordeolum.     Conjunctiva/sclera: Conjunctivae normal.     Right eye: Right conjunctiva is not injected.     Left eye: Left conjunctiva is not injected.     Pupils: Pupils are equal, round, and reactive to light.  Neck:     Thyroid: No thyromegaly.     Vascular: No JVD.     Trachea: No tracheal deviation.  Cardiovascular:     Rate and Rhythm: Normal rate and regular rhythm.     Heart sounds: Normal heart sounds. No murmur heard.   No friction rub. No gallop.  Pulmonary:     Effort: Pulmonary effort is normal. No respiratory distress.     Breath sounds: Normal breath sounds. No wheezing or rales.  Abdominal:     General: Bowel sounds are normal.     Palpations: Abdomen is soft. There is no mass.     Tenderness: There is no abdominal tenderness. There is no guarding or rebound.  Musculoskeletal:        General: No tenderness.  Normal range of motion.     Cervical back: Normal range of motion and neck supple.  Lymphadenopathy:     Cervical: No cervical adenopathy.  Skin:    General: Skin is warm.     Findings: No rash.  Neurological:     Mental Status: She is alert and oriented to person, place, and time.     Cranial Nerves: No cranial nerve deficit.  Deep Tendon Reflexes: Reflexes normal.  Psychiatric:        Mood and Affect: Mood is not anxious or depressed.    Wt Readings from Last 3 Encounters:  05/24/21 150 lb (68 kg)  10/30/20 145 lb 11.2 oz (66.1 kg)  08/18/20 148 lb (67.1 kg)    BP (!) 160/100    Pulse 84    Ht _0  (1.676 m)    Wt 150 lb (68 kg)    BMI 24.21 kg/m   Assessment and Plan:  1. Primary hypertension Chronic.  Controlled.  Stable.  Blood pressure 158/100.  We will add hydrochlorothiazide 12.5 mg to her current dosing of lisinopril 10 mg and will recheck blood pressure and 4 weeks. - hydrochlorothiazide (HYDRODIURIL) 12.5 MG tablet; Take 1 tablet (12.5 mg total) by mouth daily.  Dispense: 90 tablet; Refill: 3  2. Cigarette nicotine dependence without complication Patient has been advised of the health risks of smoking and counseled concerning cessation of tobacco products. I spent over 3 minutes for discussion and to answer questions.   3. Epistaxis, recurrent Patient experienced an episode on a cruise of a significant nosebleed which ended up closing down in bathroom because the amount of blood that was noted.  This was eventually controlled however patient has continued over the course of several weeks to have a clear nasal discharge that is bloody in nature.  It is predominantly over the left side and is associated with a fullness of the sinuses.  Patient has having some issues with her benefit card and is going to be difficult to get an x-ray at this time.  We will treat for possible sinus infection and proceed with ENT consult - Ambulatory referral to ENT  4. Acute maxillary  sinusitis, recurrence not specified Acute.  Persistent.  Stable.  In the event that there is a remote possibility of a persistent sinusitis we will treat with azithromycin to 50 mg 2 today followed by 1 a day for 4 days. - azithromycin (ZITHROMAX) 250 MG tablet; Take 2 tablets on day 1, then 1 tablet daily on days 2 through 5  Dispense: 6 tablet; Refill: 0

## 2021-06-21 ENCOUNTER — Ambulatory Visit: Payer: BC Managed Care – PPO | Admitting: Family Medicine

## 2021-06-25 ENCOUNTER — Encounter: Payer: Self-pay | Admitting: Family Medicine

## 2021-06-25 ENCOUNTER — Ambulatory Visit (INDEPENDENT_AMBULATORY_CARE_PROVIDER_SITE_OTHER): Payer: BC Managed Care – PPO | Admitting: Family Medicine

## 2021-06-25 ENCOUNTER — Other Ambulatory Visit: Payer: Self-pay

## 2021-06-25 VITALS — BP 126/78 | HR 63 | Ht 66.0 in | Wt 143.0 lb

## 2021-06-25 DIAGNOSIS — I10 Essential (primary) hypertension: Secondary | ICD-10-CM | POA: Diagnosis not present

## 2021-06-25 DIAGNOSIS — F17210 Nicotine dependence, cigarettes, uncomplicated: Secondary | ICD-10-CM | POA: Diagnosis not present

## 2021-06-25 DIAGNOSIS — E7801 Familial hypercholesterolemia: Secondary | ICD-10-CM

## 2021-06-25 NOTE — Patient Instructions (Signed)
..  mmc

## 2021-06-25 NOTE — Progress Notes (Signed)
? ? ?Date:  06/25/2021  ? ?Name:  Jamie Cervantes   DOB:  [birth year]   MRN:  [id number] ? ? ?Chief Complaint: Hypertension ? ?Hypertension ?This is a chronic problem. The current episode started more than 1 year ago. The problem has been gradually improving since onset. The problem is controlled. Associated symptoms include malaise/fatigue. Pertinent negatives include no anxiety, blurred vision, chest pain, headaches, neck pain, orthopnea, palpitations, peripheral edema, PND, shortness of breath or sweats. There are no associated agents to hypertension. There are no known risk factors for coronary artery disease. Past treatments include ACE inhibitors and diuretics. The current treatment provides moderate improvement. There are no compliance problems.  There is no history of angina, kidney disease, CAD/MI, CVA, heart failure, left ventricular hypertrophy, PVD or retinopathy. There is no history of chronic renal disease, a hypertension causing med or renovascular disease.  ? ?Lab Results  ?Component Value Date  ? NA 142 08/07/2020  ? K 4.4 08/07/2020  ? CO2 22 08/07/2020  ? GLUCOSE 93 08/07/2020  ? BUN 12 08/07/2020  ? CREATININE 0.92 08/07/2020  ? CALCIUM 9.8 08/07/2020  ? EGFR 71 08/07/2020  ? GFRNONAA 56 (L) 08/17/2019  ? ?Lab Results  ?Component Value Date  ? CHOL 235 (H) 08/07/2020  ? HDL 66 08/07/2020  ? LDLCALC 154 (H) 08/07/2020  ? TRIG 88 08/07/2020  ? ?No results found for: TSH ?No results found for: HGBA1C ?Lab Results  ?Component Value Date  ? WBC 11.1 (H) 08/07/2020  ? HGB 14.8 08/07/2020  ? HCT 43.6 08/07/2020  ? MCV 83 08/07/2020  ? PLT 290 08/07/2020  ? ?Lab Results  ?Component Value Date  ? ALT 19 08/07/2020  ? AST 21 08/07/2020  ? ALKPHOS 146 (H) 08/07/2020  ? BILITOT 0.5 08/07/2020  ? ?No results found for: 25OHVITD2, [place], VD25OH  ? ?Review of Systems  ?Constitutional:  Positive for malaise/fatigue. Negative for chills and fever.  ?HENT:  Negative for drooling, ear discharge, ear pain and sore  throat.   ?Eyes:  Negative for blurred vision.  ?Respiratory:  Negative for cough, shortness of breath and wheezing.   ?Cardiovascular:  Negative for chest pain, palpitations, orthopnea, leg swelling and PND.  ?Gastrointestinal:  Negative for abdominal pain, blood in stool, constipation, diarrhea and nausea.  ?Endocrine: Negative for polydipsia.  ?Genitourinary:  Negative for dysuria, frequency, hematuria and urgency.  ?Musculoskeletal:  Negative for back pain, myalgias and neck pain.  ?Skin:  Negative for rash.  ?Allergic/Immunologic: Negative for environmental allergies.  ?Neurological:  Negative for dizziness and headaches.  ?Hematological:  Does not bruise/bleed easily.  ?Psychiatric/Behavioral:  Negative for suicidal ideas. The patient is not nervous/anxious.   ? ?Patient Active Problem List  ? Diagnosis Date Noted  ? Colon cancer screening   ? Polyp of colon   ? H/O total hysterectomy 08/07/2020  ? ? ?Allergies  ?Allergen Reactions  ? Penicillins Nausea Only  ? ? ?Past Surgical History:  ?Procedure Laterality Date  ? ABDOMINAL HYSTERECTOMY    ? partial  ? BREAST CYST ASPIRATION Right   ? neg  ? COLONOSCOPY WITH PROPOFOL N/A 10/30/2020  ? Procedure: COLONOSCOPY WITH PROPOFOL;  Surgeon: Lucilla Lame, MD;  Location: [facility];  Service: Endoscopy;  Laterality: N/A;  1 ML ELEVIEW INJECTED IN APPENDIX/ CECUM AREA @ 1021  ? POLYPECTOMY N/A 10/30/2020  ? Procedure: POLYPECTOMY;  Surgeon: Lucilla Lame, MD;  Location: [facility];  Service: Endoscopy;  Laterality: N/A;  ? toe spurs    ? ? ?  Social History  ? ?Tobacco Use  ? Smoking status: Every Day  ?  Types: Cigarettes  ? Smokeless tobacco: Never  ? Tobacco comments:  ?  discussed smoking cessation- pt refuses/ has own plan  ?Vaping Use  ? Vaping Use: Never used  ?Substance Use Topics  ? Alcohol use: Yes  ?  Comment: occasionally  ? Drug use: Never  ? ? ? ?Medication list has been reviewed and updated. ? ?Current Meds  ?Medication Sig  ?  hydrochlorothiazide (HYDRODIURIL) 12.5 MG tablet Take 1 tablet (12.5 mg total) by mouth daily.  ? lisinopril (ZESTRIL) 10 MG tablet Take 10 mg by mouth daily. cardio  ? ? ?PHQ 2/9 Scores 05/24/2021 08/07/2020 02/11/2020 02/27/2018  ?PHQ - 2 Score 0 0 0 1  ?PHQ- 9 Score 0 1 0 4  ? ? ?GAD 7 : Generalized Anxiety Score 05/24/2021 08/07/2020 02/11/2020  ?Nervous, Anxious, on Edge 0 0 0  ?Control/stop worrying 0 2 0  ?Worry too much - different things 0 0 0  ?Trouble relaxing 0 0 0  ?Restless 0 0 0  ?Easily annoyed or irritable 0 0 0  ?Afraid - awful might happen 0 0 0  ?Total GAD 7 Score 0 2 0  ?Anxiety Difficulty Not difficult at all Not difficult at all -  ? ? ?BP Readings from Last 3 Encounters:  ?06/25/21 126/78  ?05/24/21 (!) 158/100  ?10/30/20 105/62  ? ? ?Physical Exam ?Vitals and nursing note reviewed.  ?Constitutional:   ?   Appearance: She is well-developed.  ?HENT:  ?   Head: Normocephalic.  ?   Right Ear: Tympanic membrane, ear canal and external ear normal.  ?   Left Ear: Tympanic membrane, ear canal and external ear normal.  ?   Nose: Nose normal.  ?   Mouth/Throat:  ?   Mouth: Mucous membranes are moist.  ?Eyes:  ?   General: Lids are everted, no foreign bodies appreciated. No scleral icterus.    ?   Left eye: No foreign body or hordeolum.  ?   Conjunctiva/sclera: Conjunctivae normal.  ?   Right eye: Right conjunctiva is not injected.  ?   Left eye: Left conjunctiva is not injected.  ?   Pupils: Pupils are equal, round, and reactive to light.  ?Neck:  ?   Thyroid: No thyromegaly.  ?   Vascular: No JVD.  ?   Trachea: No tracheal deviation.  ?Cardiovascular:  ?   Rate and Rhythm: Normal rate and regular rhythm.  ?   Heart sounds: Normal heart sounds. No murmur heard. ?  No friction rub. No gallop.  ?Pulmonary:  ?   Effort: Pulmonary effort is normal. No respiratory distress.  ?   Breath sounds: Normal breath sounds. No wheezing, rhonchi or rales.  ?Chest:  ?   Chest wall: No tenderness.  ?Abdominal:  ?   General:  Bowel sounds are normal.  ?   Palpations: Abdomen is soft. There is no mass.  ?   Tenderness: There is no abdominal tenderness. There is no guarding or rebound.  ?Musculoskeletal:     ?   General: No tenderness. Normal range of motion.  ?   Cervical back: Normal range of motion and neck supple.  ?Lymphadenopathy:  ?   Cervical: No cervical adenopathy.  ?Skin: ?   General: Skin is warm.  ?   Findings: No erythema or rash.  ?Neurological:  ?   Mental Status: She is alert and oriented to person,  place, and time.  ?   Cranial Nerves: No cranial nerve deficit.  ?   Deep Tendon Reflexes: Reflexes normal.  ?Psychiatric:     ?   Mood and Affect: Mood is not anxious or depressed.  ? ? ?Wt Readings from Last 3 Encounters:  ?06/25/21 143 lb (64.9 kg)  ?05/24/21 150 lb (68 kg)  ?10/30/20 145 lb 11.2 oz (66.1 kg)  ? ? ?BP 126/78   Pulse 63   Ht '5\' 6"'  (1.676 m)   Wt 143 lb (64.9 kg)   SpO2 99%   BMI 23.08 kg/m?  ? ?Assessment and Plan: ? ?1. Primary hypertension ?Chronic.  Controlled.  Stable.  Blood pressure 126/78.  Continue lisinopril 10 mg once a day and hydrochlorothiazide 12.5 mg once a day.  We will recheck patient in 6 months.  In the meantime we will check renal function panel for electrolytes and GFR. ?- Renal Function Panel ? ?2. Familial hypercholesterolemia ?Chronic.  Controlled.  Stable.  Patient is currently on diet control.  Will check lipid panel for current level of LDL. ?- Lipid Panel With LDL/HDL Ratio ? ?3. Cigarette nicotine dependence without complication ?Almost persuaded as a hymn says but not quite there.  Once again had a long discussion with patient is she quit smoking or you do not.  Patient will try to complete the cessation but it is been very tough on her. ? ? ?

## 2021-06-26 LAB — RENAL FUNCTION PANEL
Albumin: 4.2 g/dL (ref 3.8–4.8)
BUN/Creatinine Ratio: 11 — ABNORMAL LOW (ref 12–28)
BUN: 10 mg/dL (ref 8–27)
CO2: 23 mmol/L (ref 20–29)
Calcium: 9.8 mg/dL (ref 8.7–10.3)
Chloride: 101 mmol/L (ref 96–106)
Creatinine, Ser: 0.95 mg/dL (ref 0.57–1.00)
Glucose: 98 mg/dL (ref 70–99)
Phosphorus: 3 mg/dL (ref 3.0–4.3)
Potassium: 4 mmol/L (ref 3.5–5.2)
Sodium: 142 mmol/L (ref 134–144)
eGFR: 68 mL/min/{1.73_m2} (ref >59)

## 2021-06-26 LAB — LIPID PANEL WITH LDL/HDL RATIO
Cholesterol, Total: 183 mg/dL (ref 100–199)
HDL: 45 mg/dL (ref >39)
LDL Chol Calc (NIH): 122 mg/dL — ABNORMAL HIGH (ref 0–99)
LDL/HDL Ratio: 2.7 ratio (ref 0.0–3.2)
Triglycerides: 84 mg/dL (ref 0–149)
VLDL Cholesterol Cal: 16 mg/dL (ref 5–40)

## 2021-07-03 ENCOUNTER — Encounter: Payer: Self-pay | Admitting: Family Medicine

## 2021-07-03 ENCOUNTER — Ambulatory Visit: Payer: BC Managed Care – PPO | Admitting: Family Medicine

## 2021-07-03 ENCOUNTER — Other Ambulatory Visit: Payer: Self-pay

## 2021-07-03 VITALS — BP 118/80 | HR 68 | Ht 66.0 in | Wt 143.0 lb

## 2021-07-03 DIAGNOSIS — I10 Essential (primary) hypertension: Secondary | ICD-10-CM | POA: Diagnosis not present

## 2021-07-03 DIAGNOSIS — R42 Dizziness and giddiness: Secondary | ICD-10-CM | POA: Diagnosis not present

## 2021-07-03 DIAGNOSIS — E860 Dehydration: Secondary | ICD-10-CM

## 2021-07-03 MED ORDER — MECLIZINE HCL 12.5 MG PO TABS: 12.5000 mg | ORAL_TABLET | Freq: Three times a day (TID) | ORAL | 0 refills | Status: DC | PRN

## 2021-07-03 NOTE — Progress Notes (Signed)
? ? ?Date:  07/03/2021  ? ?Name:  Jamie Cervantes   DOB:  1960-05-02   MRN:  [id number] ? ? ?Chief Complaint: Dizziness ? ?Dizziness ?This is a new problem. The current episode started 1 to 4 weeks ago. The problem occurs constantly. The problem has been waxing and waning. Associated symptoms include congestion, nausea, vertigo and a visual change. Pertinent negatives include no chest pain, headaches or sore throat. Exacerbated by: turning head.  ? ?Lab Results  ?Component Value Date  ? NA 142 06/25/2021  ? K 4.0 06/25/2021  ? CO2 23 06/25/2021  ? GLUCOSE 98 06/25/2021  ? BUN 10 06/25/2021  ? CREATININE 0.95 06/25/2021  ? CALCIUM 9.8 06/25/2021  ? EGFR 68 06/25/2021  ? GFRNONAA 56 (L) 08/17/2019  ? ?Lab Results  ?Component Value Date  ? CHOL 183 06/25/2021  ? HDL 45 06/25/2021  ? LDLCALC 122 (H) 06/25/2021  ? TRIG 84 06/25/2021  ? ?No results found for: TSH ?No results found for: HGBA1C ?Lab Results  ?Component Value Date  ? WBC 11.1 (H) 08/07/2020  ? HGB 14.8 08/07/2020  ? HCT 43.6 08/07/2020  ? MCV 83 08/07/2020  ? PLT 290 08/07/2020  ? ?Lab Results  ?Component Value Date  ? ALT 19 08/07/2020  ? AST 21 08/07/2020  ? ALKPHOS 146 (H) 08/07/2020  ? BILITOT 0.5 08/07/2020  ? ?No results found for: 25OHVITD2, [place], VD25OH  ? ?Review of Systems  ?HENT:  Positive for congestion and sneezing. Negative for hearing loss, nosebleeds, postnasal drip, rhinorrhea, sinus pressure, sinus pain and sore throat.   ?Cardiovascular:  Negative for chest pain.  ?Gastrointestinal:  Positive for nausea.  ?Neurological:  Positive for dizziness and vertigo. Negative for headaches.  ? ?Patient Active Problem List  ? Diagnosis Date Noted  ? Colon cancer screening   ? Polyp of colon   ? H/O total hysterectomy 08/07/2020  ? ? ?Allergies  ?Allergen Reactions  ? Penicillins Nausea Only  ? ? ?Past Surgical History:  ?Procedure Laterality Date  ? ABDOMINAL HYSTERECTOMY    ? partial  ? BREAST CYST ASPIRATION Right   ? neg  ? COLONOSCOPY WITH  PROPOFOL N/A 10/30/2020  ? Procedure: COLONOSCOPY WITH PROPOFOL;  Surgeon: Lucilla Lame, MD;  Location: [facility];  Service: Endoscopy;  Laterality: N/A;  1 ML ELEVIEW INJECTED IN APPENDIX/ CECUM AREA @ 1021  ? POLYPECTOMY N/A 10/30/2020  ? Procedure: POLYPECTOMY;  Surgeon: Lucilla Lame, MD;  Location: [facility];  Service: Endoscopy;  Laterality: N/A;  ? toe spurs    ? ? ?Social History  ? ?Tobacco Use  ? Smoking status: Every Day  ?  Types: Cigarettes  ? Smokeless tobacco: Never  ? Tobacco comments:  ?  discussed smoking cessation- pt refuses/ has own plan  ?Vaping Use  ? Vaping Use: Never used  ?Substance Use Topics  ? Alcohol use: Yes  ?  Comment: occasionally  ? Drug use: Never  ? ? ? ?Medication list has been reviewed and updated. ? ?Current Meds  ?Medication Sig  ? hydrochlorothiazide (HYDRODIURIL) 12.5 MG tablet Take 1 tablet (12.5 mg total) by mouth daily.  ? lisinopril (ZESTRIL) 10 MG tablet Take 10 mg by mouth daily. cardio  ? ? ?PHQ 2/9 Scores 05/24/2021 08/07/2020 02/11/2020 02/27/2018  ?PHQ - 2 Score 0 0 0 1  ?PHQ- 9 Score 0 1 0 4  ? ? ?GAD 7 : Generalized Anxiety Score 05/24/2021 08/07/2020 02/11/2020  ?Nervous, Anxious, on Edge 0 0 0  ?  Control/stop worrying 0 2 0  ?Worry too much - different things 0 0 0  ?Trouble relaxing 0 0 0  ?Restless 0 0 0  ?Easily annoyed or irritable 0 0 0  ?Afraid - awful might happen 0 0 0  ?Total GAD 7 Score 0 2 0  ?Anxiety Difficulty Not difficult at all Not difficult at all -  ? ? ?BP Readings from Last 3 Encounters:  ?07/03/21 118/80  ?06/25/21 126/78  ?05/24/21 (!) 158/100  ? ? ?Physical Exam ?Vitals and nursing note reviewed. Exam conducted with a chaperone present.  ?Constitutional:   ?   General: She is not in acute distress. ?   Appearance: She is not diaphoretic.  ?HENT:  ?   Head: Normocephalic and atraumatic.  ?   Right Ear: Tympanic membrane, ear canal and external ear normal.  ?   Left Ear: Tympanic membrane, ear canal and external ear normal.  ?    Nose: Nose normal.  ?   Mouth/Throat:  ?   Lips: Pink.  ?   Mouth: Mucous membranes are dry.  ?Eyes:  ?   General:     ?   Right eye: No discharge.     ?   Left eye: No discharge.  ?   Conjunctiva/sclera: Conjunctivae normal.  ?   Pupils: Pupils are equal, round, and reactive to light.  ?Neck:  ?   Thyroid: No thyromegaly.  ?   Vascular: No JVD.  ?Cardiovascular:  ?   Rate and Rhythm: Normal rate and regular rhythm.  ?   Heart sounds: Normal heart sounds. No murmur heard. ?  No friction rub. No gallop.  ?Pulmonary:  ?   Effort: Pulmonary effort is normal.  ?   Breath sounds: Normal breath sounds.  ?Abdominal:  ?   General: Bowel sounds are normal.  ?   Palpations: Abdomen is soft. There is no mass.  ?   Tenderness: There is no abdominal tenderness. There is no guarding.  ?Musculoskeletal:     ?   General: Normal range of motion.  ?   Cervical back: Normal range of motion and neck supple.  ?Lymphadenopathy:  ?   Cervical: No cervical adenopathy.  ?Skin: ?   General: Skin is warm and dry.  ?Neurological:  ?   Mental Status: She is alert.  ?   Cranial Nerves: Cranial nerves 2-12 are intact. No facial asymmetry.  ?   Sensory: Sensation is intact.  ?   Motor: Motor function is intact.  ?   Deep Tendon Reflexes:  ?   Reflex Scores: ?     Tricep reflexes are 2+ on the right side and 2+ on the left side. ?     Bicep reflexes are 2+ on the right side and 2+ on the left side. ?     Brachioradialis reflexes are 2+ on the right side and 2+ on the left side. ?     Patellar reflexes are 2+ on the right side and 2+ on the left side. ?     Achilles reflexes are 2+ on the right side and 2+ on the left side. ? ? ?Wt Readings from Last 3 Encounters:  ?07/03/21 143 lb (64.9 kg)  ?06/25/21 143 lb (64.9 kg)  ?05/24/21 150 lb (68 kg)  ? ? ?BP 118/80   Pulse 68   Ht _0  (1.676 m)   Wt 143 lb (64.9 kg)   BMI 23.08 kg/m?  ? ?Assessment and Plan: ? ?1.  Vertigo ?New onset.  Persistent.  Patient has noticed a definite vertigo-like  dizziness associated with turning of the head with a pulling to one side walking down hallways.  This is also associated with nausea and consistent with vertigo.  O2 exam is normal and neurologic exam is normal.  This would appear to be more of a labyrinthitis and patient has been given some meclizine for the symptomatology and is to return if it continues beyond 48 hours.  I have offered to recheck patient tomorrow prior to her returning to work. ? ?2. Dehydration ?New onset.  Persistent.  Mucous membranes notes decreased moisture which is consistent with dehydration.  Patient is also having some orthostatic dizziness as well which is probably secondary to inadequate oral intake of fluids and diuretic.  Patient's been instructed to rehydrate and will recheck in 24 to 48 hours. ? ?3. Primary hypertension ?Chronic.  Controlled.  Stable.  Several weeks ago we placed on a diuretic and patient had a ?Blood pressure reading but today it is in normal range but a little more so on the low side at 118/80.  We will discontinue the hydrochlorothiazide and recheck patient in 24 to 48 hours for blood pressure control and proceed accordingly ? ? ?

## 2021-07-04 ENCOUNTER — Ambulatory Visit: Payer: BC Managed Care – PPO | Admitting: Family Medicine

## 2021-07-04 ENCOUNTER — Encounter: Payer: Self-pay | Admitting: Family Medicine

## 2021-07-04 VITALS — BP 120/82 | HR 68 | Ht 66.0 in | Wt 143.0 lb

## 2021-07-04 DIAGNOSIS — E860 Dehydration: Secondary | ICD-10-CM

## 2021-07-04 DIAGNOSIS — I10 Essential (primary) hypertension: Secondary | ICD-10-CM | POA: Diagnosis not present

## 2021-07-04 DIAGNOSIS — R42 Dizziness and giddiness: Secondary | ICD-10-CM

## 2021-07-04 NOTE — Progress Notes (Signed)
? ? ?Date:  07/04/2021  ? ?Name:  Jamie Cervantes   DOB:  Jul 18, 1960   MRN:  [id number] ? ? ?Chief Complaint: Follow-up (Held HCTZ, started meclizine for dizziness and pushed fluids- feeling much better today) ? ?Hypertension ?This is a chronic problem. The current episode started more than 1 year ago. The problem has been gradually improving since onset. The problem is controlled. Pertinent negatives include no chest pain, headaches, neck pain, orthopnea, palpitations, peripheral edema, PND or shortness of breath. Risk factors for coronary artery disease include dyslipidemia. Past treatments include ACE inhibitors. The current treatment provides moderate improvement. There are no compliance problems.  There is no history of angina, kidney disease, CAD/MI, CVA, heart failure, left ventricular hypertrophy, PVD or retinopathy. There is no history of chronic renal disease, a hypertension causing med or renovascular disease.  ?Dizziness ?The current episode started in the past 7 days. The problem has been rapidly improving. Pertinent negatives include no abdominal pain, chest pain, chills, coughing, fatigue, fever, headaches, myalgias, nausea, neck pain, numbness, rash, sore throat or vertigo. Associated symptoms comments: No orthostatic dizziness. Exacerbated by: no dizziness turning head. The treatment provided moderate relief.  ? ?Lab Results  ?Component Value Date  ? NA 142 06/25/2021  ? K 4.0 06/25/2021  ? CO2 23 06/25/2021  ? GLUCOSE 98 06/25/2021  ? BUN 10 06/25/2021  ? CREATININE 0.95 06/25/2021  ? CALCIUM 9.8 06/25/2021  ? EGFR 68 06/25/2021  ? GFRNONAA 56 (L) 08/17/2019  ? ?Lab Results  ?Component Value Date  ? CHOL 183 06/25/2021  ? HDL 45 06/25/2021  ? LDLCALC 122 (H) 06/25/2021  ? TRIG 84 06/25/2021  ? ?No results found for: TSH ?No results found for: HGBA1C ?Lab Results  ?Component Value Date  ? WBC 11.1 (H) 08/07/2020  ? HGB 14.8 08/07/2020  ? HCT 43.6 08/07/2020  ? MCV 83 08/07/2020  ? PLT 290 08/07/2020   ? ?Lab Results  ?Component Value Date  ? ALT 19 08/07/2020  ? AST 21 08/07/2020  ? ALKPHOS 146 (H) 08/07/2020  ? BILITOT 0.5 08/07/2020  ? ?No results found for: 25OHVITD2, [place], VD25OH  ? ?Review of Systems  ?Constitutional:  Negative for chills, fatigue and fever.  ?HENT:  Negative for drooling, ear discharge, ear pain and sore throat.   ?Respiratory:  Negative for cough, shortness of breath and wheezing.   ?Cardiovascular:  Negative for chest pain, palpitations, orthopnea, leg swelling and PND.  ?Gastrointestinal:  Negative for abdominal pain, blood in stool, constipation, diarrhea and nausea.  ?Endocrine: Negative for polydipsia.  ?Genitourinary:  Negative for dysuria, frequency, hematuria and urgency.  ?Musculoskeletal:  Negative for back pain, myalgias and neck pain.  ?Skin:  Negative for rash.  ?Allergic/Immunologic: Negative for environmental allergies.  ?Neurological:  Positive for dizziness. Negative for vertigo, numbness and headaches.  ?Hematological:  Does not bruise/bleed easily.  ?Psychiatric/Behavioral:  Negative for suicidal ideas. The patient is not nervous/anxious.   ? ?Patient Active Problem List  ? Diagnosis Date Noted  ? Colon cancer screening   ? Polyp of colon   ? H/O total hysterectomy 08/07/2020  ? ? ?Allergies  ?Allergen Reactions  ? Penicillins Nausea Only  ? ? ?Past Surgical History:  ?Procedure Laterality Date  ? ABDOMINAL HYSTERECTOMY    ? partial  ? BREAST CYST ASPIRATION Right   ? neg  ? COLONOSCOPY WITH PROPOFOL N/A 10/30/2020  ? Procedure: COLONOSCOPY WITH PROPOFOL;  Surgeon: Lucilla Lame, MD;  Location: [facility];  Service: Endoscopy;  Laterality: N/A;  1 ML ELEVIEW INJECTED IN APPENDIX/ CECUM AREA @ 1021  ? POLYPECTOMY N/A 10/30/2020  ? Procedure: POLYPECTOMY;  Surgeon: Lucilla Lame, MD;  Location: [facility];  Service: Endoscopy;  Laterality: N/A;  ? toe spurs    ? ? ?Social History  ? ?Tobacco Use  ? Smoking status: Every Day  ?  Types: Cigarettes  ?  Smokeless tobacco: Never  ? Tobacco comments:  ?  discussed smoking cessation- pt refuses/ has own plan  ?Vaping Use  ? Vaping Use: Never used  ?Substance Use Topics  ? Alcohol use: Yes  ?  Comment: occasionally  ? Drug use: Never  ? ? ? ?Medication list has been reviewed and updated. ? ?Current Meds  ?Medication Sig  ? lisinopril (ZESTRIL) 10 MG tablet Take 10 mg by mouth daily. cardio  ? meclizine (ANTIVERT) 12.5 MG tablet Take 1 tablet (12.5 mg total) by mouth 3 (three) times daily as needed for dizziness.  ? ? ?PHQ 2/9 Scores 05/24/2021 08/07/2020 02/11/2020 02/27/2018  ?PHQ - 2 Score 0 0 0 1  ?PHQ- 9 Score 0 1 0 4  ? ? ?GAD 7 : Generalized Anxiety Score 05/24/2021 08/07/2020 02/11/2020  ?Nervous, Anxious, on Edge 0 0 0  ?Control/stop worrying 0 2 0  ?Worry too much - different things 0 0 0  ?Trouble relaxing 0 0 0  ?Restless 0 0 0  ?Easily annoyed or irritable 0 0 0  ?Afraid - awful might happen 0 0 0  ?Total GAD 7 Score 0 2 0  ?Anxiety Difficulty Not difficult at all Not difficult at all -  ? ? ?BP Readings from Last 3 Encounters:  ?07/04/21 120/82  ?07/03/21 118/80  ?06/25/21 126/78  ? ? ?Physical Exam ?Vitals and nursing note reviewed.  ?Constitutional:   ?   Appearance: She is well-developed.  ?HENT:  ?   Head: Normocephalic.  ?   Right Ear: Tympanic membrane and external ear normal.  ?   Left Ear: Tympanic membrane and external ear normal.  ?   Nose: Nose normal.  ?Eyes:  ?   General: Lids are everted, no foreign bodies appreciated. No scleral icterus.    ?   Left eye: No foreign body or hordeolum.  ?   Conjunctiva/sclera: Conjunctivae normal.  ?   Right eye: Right conjunctiva is not injected.  ?   Left eye: Left conjunctiva is not injected.  ?   Pupils: Pupils are equal, round, and reactive to light.  ?Neck:  ?   Thyroid: No thyromegaly.  ?   Vascular: No JVD.  ?   Trachea: No tracheal deviation.  ?Cardiovascular:  ?   Rate and Rhythm: Normal rate and regular rhythm.  ?   Heart sounds: Normal heart sounds, S1  normal and S2 normal. No murmur heard. ?No systolic murmur is present.  ?No diastolic murmur is present.  ?  No friction rub. No gallop. No S3 or S4 sounds.  ?Pulmonary:  ?   Effort: Pulmonary effort is normal. No respiratory distress.  ?   Breath sounds: No wheezing, rhonchi or rales.  ?Abdominal:  ?   General: Bowel sounds are normal.  ?   Palpations: Abdomen is soft. There is no mass.  ?   Tenderness: There is no abdominal tenderness. There is no guarding or rebound.  ?Musculoskeletal:     ?   General: No tenderness. Normal range of motion.  ?   Cervical back: Normal range of motion and neck  supple.  ?Lymphadenopathy:  ?   Cervical: No cervical adenopathy.  ?Skin: ?   General: Skin is warm.  ?   Findings: No rash.  ?Neurological:  ?   Mental Status: She is alert and oriented to person, place, and time.  ?   Cranial Nerves: No cranial nerve deficit.  ?   Deep Tendon Reflexes: Reflexes normal.  ?Psychiatric:     ?   Mood and Affect: Mood is not anxious or depressed.  ? ? ?Wt Readings from Last 3 Encounters:  ?07/04/21 143 lb (64.9 kg)  ?07/03/21 143 lb (64.9 kg)  ?06/25/21 143 lb (64.9 kg)  ? ? ?BP 120/82   Pulse 68   Ht '5\' 6"'  (1.676 m)   Wt 143 lb (64.9 kg)   BMI 23.08 kg/m?  ? ?Assessment and Plan: ? ?1. Vertigo ?New onset.  Controlled.  Essentially resolved.  This was likely a bout with labyrinthitis, likely of viral nature.  Patient has no vertigo and is able to turn her head without dizziness.  Patient is only to take the meclizine as needed otherwise we will can continue to watch and patient is to return if symptoms repeat ? ?2. Dehydration ?New onset.  Controlled.  Essentially resolved.  With combination of discontinuance of the diuretic and the rehydration patient has had improvement of her orthostatic dizziness.  Exam notes that there is increased mucous membrane moisture and that patient is currently off diuretic without symptomatology. ? ?3. Primary hypertension ?Chronic.  Controlled.  Stable.  Blood  pressure 120/82.  Patient is doing well off of diuretic and will continue her lisinopril and decrease sodium intake.  We will recheck patient in 6 weeks. ? ? ?

## 2021-08-02 ENCOUNTER — Other Ambulatory Visit: Payer: Self-pay

## 2021-08-02 ENCOUNTER — Telehealth: Payer: Self-pay

## 2021-08-02 DIAGNOSIS — Z1231 Encounter for screening mammogram for malignant neoplasm of breast: Secondary | ICD-10-CM

## 2021-08-02 NOTE — Telephone Encounter (Signed)
Called pt- she requested I leave message on her voicemail- her mammo is scheduled for [place] on 09/12/2021@ 230- I did leave info on vm ?

## 2021-08-09 ENCOUNTER — Ambulatory Visit (INDEPENDENT_AMBULATORY_CARE_PROVIDER_SITE_OTHER): Payer: BC Managed Care – PPO | Admitting: Family Medicine

## 2021-08-10 NOTE — Progress Notes (Signed)
Pt was last minute cancellation/ no show ?

## 2021-08-15 ENCOUNTER — Ambulatory Visit: Payer: BC Managed Care – PPO | Admitting: Family Medicine

## 2021-08-20 ENCOUNTER — Ambulatory Visit (INDEPENDENT_AMBULATORY_CARE_PROVIDER_SITE_OTHER): Payer: BC Managed Care – PPO | Admitting: Family Medicine

## 2021-08-20 ENCOUNTER — Encounter: Payer: Self-pay | Admitting: Family Medicine

## 2021-08-20 VITALS — BP 130/64 | HR 60 | Ht 66.0 in | Wt 143.0 lb

## 2021-08-20 DIAGNOSIS — Z0000 Encounter for general adult medical examination without abnormal findings: Secondary | ICD-10-CM

## 2021-08-20 DIAGNOSIS — F17210 Nicotine dependence, cigarettes, uncomplicated: Secondary | ICD-10-CM

## 2021-08-20 DIAGNOSIS — Z1211 Encounter for screening for malignant neoplasm of colon: Secondary | ICD-10-CM

## 2021-08-20 DIAGNOSIS — N3001 Acute cystitis with hematuria: Secondary | ICD-10-CM | POA: Diagnosis not present

## 2021-08-20 LAB — POCT URINALYSIS DIPSTICK
Bilirubin, UA: NEGATIVE
Glucose, UA: NEGATIVE
Ketones, UA: NEGATIVE
Nitrite, UA: NEGATIVE
Protein, UA: NEGATIVE
Spec Grav, UA: 1.01 (ref 1.010–1.025)
Urobilinogen, UA: 0.2 E.U./dL
pH, UA: 6 (ref 5.0–8.0)

## 2021-08-20 LAB — HEMOCCULT GUIAC POC 1CARD (OFFICE): Fecal Occult Blood, POC: NEGATIVE

## 2021-08-20 MED ORDER — NITROFURANTOIN MONOHYD MACRO 100 MG PO CAPS: 100.0000 mg | ORAL_CAPSULE | Freq: Two times a day (BID) | ORAL | 0 refills | Status: AC

## 2021-08-20 NOTE — Progress Notes (Signed)
? ? ?Date:  08/20/2021  ? ?Name:  Jamie Cervantes   DOB:  10-27-1960   MRN:  [id number] ? ? ?Chief Complaint: Annual Exam (No pap) ? ?Patient is a 61year old female who presents for a comprehensive physical exam. The patient reports the following problems: Unremarkable except for some "loose bowels". Health maintenance has been reviewed needs mammogram ?  ? ? ?Lab Results  ?Component Value Date  ? NA 142 06/25/2021  ? K 4.0 06/25/2021  ? CO2 23 06/25/2021  ? GLUCOSE 98 06/25/2021  ? BUN 10 06/25/2021  ? CREATININE 0.95 06/25/2021  ? CALCIUM 9.8 06/25/2021  ? EGFR 68 06/25/2021  ? GFRNONAA 56 (L) 08/17/2019  ? ?Lab Results  ?Component Value Date  ? CHOL 183 06/25/2021  ? HDL 45 06/25/2021  ? LDLCALC 122 (H) 06/25/2021  ? TRIG 84 06/25/2021  ? ?No results found for: TSH ?No results found for: HGBA1C ?Lab Results  ?Component Value Date  ? WBC 11.1 (H) 08/07/2020  ? HGB 14.8 08/07/2020  ? HCT 43.6 08/07/2020  ? MCV 83 08/07/2020  ? PLT 290 08/07/2020  ? ?Lab Results  ?Component Value Date  ? ALT 19 08/07/2020  ? AST 21 08/07/2020  ? ALKPHOS 146 (H) 08/07/2020  ? BILITOT 0.5 08/07/2020  ? ?No results found for: 25OHVITD2, [place], VD25OH  ? ?Review of Systems  ?Constitutional: Negative.  Negative for chills, fatigue, fever and unexpected weight change.  ?HENT:  Negative for congestion, ear discharge, ear pain, rhinorrhea, sinus pressure, sneezing and sore throat.   ?Respiratory:  Negative for cough, shortness of breath, wheezing and stridor.   ?Gastrointestinal:  Negative for abdominal pain, blood in stool, constipation, diarrhea and nausea.  ?Genitourinary:  Negative for dysuria, flank pain, frequency, hematuria, urgency and vaginal discharge.  ?Musculoskeletal:  Negative for arthralgias, back pain and myalgias.  ?Skin:  Negative for rash.  ?Neurological:  Negative for dizziness, weakness and headaches.  ?Hematological:  Negative for adenopathy. Does not bruise/bleed easily.  ?Psychiatric/Behavioral:  Negative for  dysphoric mood. The patient is not nervous/anxious.   ? ?Patient Active Problem List  ? Diagnosis Date Noted  ? Colon cancer screening   ? Polyp of colon   ? H/O total hysterectomy 08/07/2020  ? ? ?Allergies  ?Allergen Reactions  ? Penicillins Nausea Only  ? ? ?Past Surgical History:  ?Procedure Laterality Date  ? ABDOMINAL HYSTERECTOMY    ? partial  ? BREAST CYST ASPIRATION Right   ? neg  ? COLONOSCOPY WITH PROPOFOL N/A 10/30/2020  ? Procedure: COLONOSCOPY WITH PROPOFOL;  Surgeon: Lucilla Lame, MD;  Location: [facility];  Service: Endoscopy;  Laterality: N/A;  1 ML ELEVIEW INJECTED IN APPENDIX/ CECUM AREA @ 1021  ? POLYPECTOMY N/A 10/30/2020  ? Procedure: POLYPECTOMY;  Surgeon: Lucilla Lame, MD;  Location: [facility];  Service: Endoscopy;  Laterality: N/A;  ? toe spurs    ? ? ?Social History  ? ?Tobacco Use  ? Smoking status: Every Day  ?  Types: Cigarettes  ? Smokeless tobacco: Never  ? Tobacco comments:  ?  discussed smoking cessation- pt refuses/ has own plan  ?Vaping Use  ? Vaping Use: Never used  ?Substance Use Topics  ? Alcohol use: Yes  ?  Comment: occasionally  ? Drug use: Never  ? ? ? ?Medication list has been reviewed and updated. ? ?Current Meds  ?Medication Sig  ? lisinopril (ZESTRIL) 10 MG tablet Take 10 mg by mouth daily. cardio  ? meclizine (ANTIVERT)  12.5 MG tablet Take 1 tablet (12.5 mg total) by mouth 3 (three) times daily as needed for dizziness.  ? ? ? ?  08/20/2021  ?  8:45 AM 05/24/2021  ? 11:42 AM 08/07/2020  ?  8:54 AM 02/11/2020  ? 11:35 AM  ?GAD 7 : Generalized Anxiety Score  ?Nervous, Anxious, on Edge 0 0 0 0  ?Control/stop worrying 0 0 2 0  ?Worry too much - different things 0 0 0 0  ?Trouble relaxing 0 0 0 0  ?Restless 0 0 0 0  ?Easily annoyed or irritable 0 0 0 0  ?Afraid - awful might happen 0 0 0 0  ?Total GAD 7 Score 0 0 2 0  ?Anxiety Difficulty Not difficult at all Not difficult at all Not difficult at all   ? ? ? ?  08/20/2021  ?  8:45 AM  ?Depression screen PHQ 2/9   ?Decreased Interest 0  ?Down, Depressed, Hopeless 0  ?PHQ - 2 Score 0  ?Altered sleeping 0  ?Tired, decreased energy 0  ?Change in appetite 0  ?Feeling bad or failure about yourself  0  ?Trouble concentrating 0  ?Moving slowly or fidgety/restless 0  ?Suicidal thoughts 0  ?PHQ-9 Score 0  ?Difficult doing work/chores Not difficult at all  ? ? ?BP Readings from Last 3 Encounters:  ?08/20/21 130/64  ?07/04/21 120/82  ?07/03/21 118/80  ? ? ?Physical Exam ?Vitals and nursing note reviewed.  ?Constitutional:   ?   Appearance: She is well-developed and well-groomed.  ?HENT:  ?   Head: Normocephalic.  ?   Jaw: There is normal jaw occlusion.  ?   Right Ear: Hearing, tympanic membrane, ear canal and external ear normal.  ?   Left Ear: Hearing, tympanic membrane, ear canal and external ear normal.  ?   Nose: Nose normal.  ?Eyes:  ?   General: Lids are normal. Lids are everted, no foreign bodies appreciated. Vision grossly intact. Gaze aligned appropriately. No scleral icterus.    ?   Left eye: No foreign body or hordeolum.  ?   Extraocular Movements: Extraocular movements intact.  ?   Conjunctiva/sclera: Conjunctivae normal.  ?   Right eye: Right conjunctiva is not injected.  ?   Left eye: Left conjunctiva is not injected.  ?   Pupils: Pupils are equal, round, and reactive to light.  ?   Funduscopic exam: ?   Right eye: Red reflex present.     ?   Left eye: Red reflex present. ?Neck:  ?   Thyroid: No thyroid mass, thyromegaly or thyroid tenderness.  ?   Vascular: Normal carotid pulses. No carotid bruit, hepatojugular reflux or JVD.  ?   Trachea: Trachea and phonation normal. No tracheal deviation.  ?Cardiovascular:  ?   Rate and Rhythm: Normal rate and regular rhythm.  ?   Pulses: Normal pulses.     ?     Carotid pulses are 2+ on the right side and 2+ on the left side. ?     Radial pulses are 2+ on the right side and 2+ on the left side.  ?     Femoral pulses are 2+ on the right side and 2+ on the left side. ?     Popliteal  pulses are 2+ on the right side and 2+ on the left side.  ?     Dorsalis pedis pulses are 2+ on the right side and 2+ on the left side.  ?  Posterior tibial pulses are 2+ on the right side and 2+ on the left side.  ?   Heart sounds: Normal heart sounds, S1 normal and S2 normal. No murmur heard. ?No systolic murmur is present.  ?No diastolic murmur is present.  ?  No friction rub. No gallop. No S3 or S4 sounds.  ?Pulmonary:  ?   Effort: Pulmonary effort is normal. No respiratory distress.  ?   Breath sounds: Normal breath sounds. No decreased breath sounds, wheezing, rhonchi or rales.  ?Chest:  ?   Chest wall: No mass.  ?Breasts: ?   Right: Normal. No swelling, bleeding, inverted nipple, mass, nipple discharge, skin change or tenderness.  ?   Left: Normal. No swelling, bleeding, inverted nipple, mass, nipple discharge, skin change or tenderness.  ?Abdominal:  ?   General: Bowel sounds are normal.  ?   Palpations: Abdomen is soft. There is no hepatomegaly, splenomegaly or mass.  ?   Tenderness: There is no abdominal tenderness. There is no guarding or rebound.  ?   Hernia: No hernia is present. There is no hernia in the umbilical area, ventral area, left inguinal area or right inguinal area.  ?Genitourinary: ?   Rectum: Normal. Guaiac result negative. No mass.  ?Musculoskeletal:     ?   General: No tenderness. Normal range of motion.  ?   Cervical back: Normal, normal range of motion and neck supple.  ?   Right lower leg: No edema.  ?   Left lower leg: No edema.  ?Lymphadenopathy:  ?   Head:  ?   Right side of head: No submental or submandibular adenopathy.  ?   Left side of head: No submental or submandibular adenopathy.  ?   Cervical: No cervical adenopathy.  ?   Right cervical: No superficial, deep or posterior cervical adenopathy. ?   Left cervical: No superficial, deep or posterior cervical adenopathy.  ?   Upper Body:  ?   Right upper body: No supraclavicular or axillary adenopathy.  ?   Left upper body: No  supraclavicular or axillary adenopathy.  ?Skin: ?   General: Skin is warm.  ?   Capillary Refill: Capillary refill takes less than 2 seconds.  ?   Findings: No rash.  ?Neurological:  ?   Mental Status: She is alert

## 2021-08-21 LAB — CBC WITH DIFFERENTIAL/PLATELET
Basophils Absolute: 0.1 10*3/uL (ref 0.0–0.2)
Basos: 1 %
EOS (ABSOLUTE): 0.1 10*3/uL (ref 0.0–0.4)
Eos: 1 %
Hematocrit: 42.1 % (ref 34.0–46.6)
Hemoglobin: 13.6 g/dL (ref 11.1–15.9)
Immature Grans (Abs): 0 10*3/uL (ref 0.0–0.1)
Immature Granulocytes: 0 %
Lymphocytes Absolute: 4.2 10*3/uL — ABNORMAL HIGH (ref 0.7–3.1)
Lymphs: 41 %
MCH: 27.9 pg (ref 26.6–33.0)
MCHC: 32.3 g/dL (ref 31.5–35.7)
MCV: 86 fL (ref 79–97)
Monocytes Absolute: 0.9 10*3/uL (ref 0.1–0.9)
Monocytes: 9 %
Neutrophils Absolute: 4.7 10*3/uL (ref 1.4–7.0)
Neutrophils: 48 %
Platelets: 262 10*3/uL (ref 150–450)
RBC: 4.87 x10E6/uL (ref 3.77–5.28)
RDW: 14.6 % (ref 11.7–15.4)
WBC: 10 10*3/uL (ref 3.4–10.8)

## 2021-08-23 ENCOUNTER — Encounter: Payer: Self-pay | Admitting: Family Medicine

## 2021-08-23 ENCOUNTER — Ambulatory Visit: Payer: BC Managed Care – PPO | Admitting: Family Medicine

## 2021-08-23 VITALS — BP 130/100 | HR 64 | Ht 66.0 in | Wt 143.0 lb

## 2021-08-23 DIAGNOSIS — R3121 Asymptomatic microscopic hematuria: Secondary | ICD-10-CM | POA: Diagnosis not present

## 2021-08-23 DIAGNOSIS — I10 Essential (primary) hypertension: Secondary | ICD-10-CM

## 2021-08-23 DIAGNOSIS — F17210 Nicotine dependence, cigarettes, uncomplicated: Secondary | ICD-10-CM | POA: Diagnosis not present

## 2021-08-23 LAB — POCT URINALYSIS DIPSTICK
Bilirubin, UA: NEGATIVE
Glucose, UA: NEGATIVE
Ketones, UA: NEGATIVE
Leukocytes, UA: NEGATIVE
Nitrite, UA: NEGATIVE
Protein, UA: NEGATIVE
Spec Grav, UA: 1.01 (ref 1.010–1.025)
Urobilinogen, UA: 0.2 E.U./dL
pH, UA: 5 (ref 5.0–8.0)

## 2021-08-23 NOTE — Patient Instructions (Signed)

## 2021-08-23 NOTE — Progress Notes (Signed)
? ? ?Date:  08/23/2021  ? ?Name:  Jamie Cervantes   DOB:  October 21, 1960   MRN:  [id number] ? ? ?Chief Complaint: Follow-up (UTI- finished antibiotic) ? ?Hematuria ?This is a new (microscopic) problem. The problem is unchanged. She describes the hematuria as microscopic hematuria. Hematuria confirmed by urinalysis. She is experiencing no pain. She describes her urine color as clear. Irritative symptoms do not include frequency, nocturia or urgency. Associated symptoms include dysuria. Pertinent negatives include no abdominal pain, chills, fever, hesitancy, inability to urinate or nausea. Her past medical history is significant for recent infection. There is no history of kidney stones.  ? ?Lab Results  ?Component Value Date  ? NA 142 06/25/2021  ? K 4.0 06/25/2021  ? CO2 23 06/25/2021  ? GLUCOSE 98 06/25/2021  ? BUN 10 06/25/2021  ? CREATININE 0.95 06/25/2021  ? CALCIUM 9.8 06/25/2021  ? EGFR 68 06/25/2021  ? GFRNONAA 56 (L) 08/17/2019  ? ?Lab Results  ?Component Value Date  ? CHOL 183 06/25/2021  ? HDL 45 06/25/2021  ? LDLCALC 122 (H) 06/25/2021  ? TRIG 84 06/25/2021  ? ?No results found for: TSH ?No results found for: HGBA1C ?Lab Results  ?Component Value Date  ? WBC 10.0 08/20/2021  ? HGB 13.6 08/20/2021  ? HCT 42.1 08/20/2021  ? MCV 86 08/20/2021  ? PLT 262 08/20/2021  ? ?Lab Results  ?Component Value Date  ? ALT 19 08/07/2020  ? AST 21 08/07/2020  ? ALKPHOS 146 (H) 08/07/2020  ? BILITOT 0.5 08/07/2020  ? ?No results found for: 25OHVITD2, [place], VD25OH  ? ?Review of Systems  ?Constitutional:  Negative for chills and fever.  ?HENT:  Negative for drooling, ear discharge, ear pain and sore throat.   ?Respiratory:  Negative for cough, shortness of breath and wheezing.   ?Cardiovascular:  Negative for chest pain, palpitations and leg swelling.  ?Gastrointestinal:  Negative for abdominal pain, blood in stool, constipation, diarrhea and nausea.  ?Endocrine: Negative for polydipsia.  ?Genitourinary:  Positive for dysuria  and hematuria. Negative for frequency, hesitancy, nocturia and urgency.  ?Musculoskeletal:  Negative for back pain, myalgias and neck pain.  ?Skin:  Negative for rash.  ?Allergic/Immunologic: Negative for environmental allergies.  ?Neurological:  Negative for dizziness and headaches.  ?Hematological:  Does not bruise/bleed easily.  ?Psychiatric/Behavioral:  Negative for suicidal ideas. The patient is not nervous/anxious.   ? ?Patient Active Problem List  ? Diagnosis Date Noted  ? Colon cancer screening   ? Polyp of colon   ? H/O total hysterectomy 08/07/2020  ? ? ?Allergies  ?Allergen Reactions  ? Penicillins Nausea Only  ? ? ?Past Surgical History:  ?Procedure Laterality Date  ? ABDOMINAL HYSTERECTOMY    ? partial  ? BREAST CYST ASPIRATION Right   ? neg  ? COLONOSCOPY WITH PROPOFOL N/A 10/30/2020  ? Procedure: COLONOSCOPY WITH PROPOFOL;  Surgeon: Lucilla Lame, MD;  Location: [facility];  Service: Endoscopy;  Laterality: N/A;  1 ML ELEVIEW INJECTED IN APPENDIX/ CECUM AREA @ 1021  ? POLYPECTOMY N/A 10/30/2020  ? Procedure: POLYPECTOMY;  Surgeon: Lucilla Lame, MD;  Location: [facility];  Service: Endoscopy;  Laterality: N/A;  ? toe spurs    ? ? ?Social History  ? ?Tobacco Use  ? Smoking status: Every Day  ?  Types: Cigarettes  ? Smokeless tobacco: Never  ? Tobacco comments:  ?  discussed smoking cessation- pt refuses/ has own plan  ?Vaping Use  ? Vaping Use: Never used  ?Substance  Use Topics  ? Alcohol use: Yes  ?  Comment: occasionally  ? Drug use: Never  ? ? ? ?Medication list has been reviewed and updated. ? ?Current Meds  ?Medication Sig  ? lisinopril (ZESTRIL) 10 MG tablet Take 10 mg by mouth daily. cardio  ? meclizine (ANTIVERT) 12.5 MG tablet Take 1 tablet (12.5 mg total) by mouth 3 (three) times daily as needed for dizziness.  ? ? ? ?  08/23/2021  ?  8:14 AM 08/20/2021  ?  8:45 AM 05/24/2021  ? 11:42 AM 08/07/2020  ?  8:54 AM  ?GAD 7 : Generalized Anxiety Score  ?Nervous, Anxious, on Edge 0 0 0 0   ?Control/stop worrying 0 0 0 2  ?Worry too much - different things 0 0 0 0  ?Trouble relaxing 0 0 0 0  ?Restless 0 0 0 0  ?Easily annoyed or irritable 0 0 0 0  ?Afraid - awful might happen 0 0 0 0  ?Total GAD 7 Score 0 0 0 2  ?Anxiety Difficulty  Not difficult at all Not difficult at all Not difficult at all  ? ? ? ?  08/23/2021  ?  8:14 AM  ?Depression screen PHQ 2/9  ?Decreased Interest 2  ?Down, Depressed, Hopeless 1  ?PHQ - 2 Score 3  ?Altered sleeping 0  ?Tired, decreased energy 0  ?Change in appetite 0  ?Feeling bad or failure about yourself  0  ?Trouble concentrating 0  ?Moving slowly or fidgety/restless 0  ?Suicidal thoughts 0  ?PHQ-9 Score 3  ?Difficult doing work/chores Not difficult at all  ? ? ?BP Readings from Last 3 Encounters:  ?08/23/21 (!) 130/100  ?08/20/21 130/64  ?07/04/21 120/82  ? ? ?Physical Exam ?Vitals and nursing note reviewed. Exam conducted with a chaperone present.  ?Constitutional:   ?   General: She is not in acute distress. ?   Appearance: She is not diaphoretic.  ?HENT:  ?   Head: Normocephalic and atraumatic.  ?   Right Ear: External ear normal.  ?   Left Ear: External ear normal.  ?   Nose: Nose normal.  ?Eyes:  ?   General:     ?   Right eye: No discharge.     ?   Left eye: No discharge.  ?   Conjunctiva/sclera: Conjunctivae normal.  ?   Pupils: Pupils are equal, round, and reactive to light.  ?Neck:  ?   Thyroid: No thyromegaly.  ?   Vascular: No JVD.  ?Cardiovascular:  ?   Rate and Rhythm: Normal rate and regular rhythm.  ?   Heart sounds: Normal heart sounds. No murmur heard. ?  No friction rub. No gallop.  ?Pulmonary:  ?   Effort: Pulmonary effort is normal.  ?   Breath sounds: Normal breath sounds.  ?Abdominal:  ?   General: Bowel sounds are normal.  ?   Palpations: Abdomen is soft. There is no mass.  ?   Tenderness: There is no abdominal tenderness. There is no guarding.  ?Musculoskeletal:     ?   General: Normal range of motion.  ?   Cervical back: Normal range of motion and  neck supple.  ?Lymphadenopathy:  ?   Cervical: No cervical adenopathy.  ?Skin: ?   General: Skin is warm and dry.  ?Neurological:  ?   Mental Status: She is alert.  ?   Deep Tendon Reflexes: Reflexes are normal and symmetric.  ? ? ?Wt Readings from Last 3  Encounters:  ?08/23/21 143 lb (64.9 kg)  ?08/20/21 143 lb (64.9 kg)  ?07/04/21 143 lb (64.9 kg)  ? ? ?BP (!) 130/100   Pulse 64   Ht _0  (1.676 m)   Wt 143 lb (64.9 kg)   BMI 23.08 kg/m?  ? ?Assessment and Plan: ? ?1. Asymptomatic microscopic hematuria ?New onset.  Persistent.  Patient I think noted some gross hematuria early.  Status post UTI was treated with antibiotic and recheck in leukocytes have resolved however patient continues to have 3+ dipstick red cells.  Given patient's history of smoking we will refer to urology for further evaluation. ?- POCT urinalysis dipstick ?- Ambulatory referral to Urology ? ?2. Cigarette nicotine dependence without complication ?Patient has been advised of the health risks of smoking and counseled concerning cessation of tobacco products. I spent over 3 minutes for discussion and to answer questions.   ? ?3.  Primary hypertension.  Chronic.  Uncontrolled.  The patient just took diuretic and rushed to get here for the appointment.  I do not think is Penninger sufficiently has been over 24 hours of effectiveness.  We will recheck patient in at next visit in the meantime I have reemphasized DASH diet. ?

## 2021-08-30 ENCOUNTER — Other Ambulatory Visit: Payer: Self-pay | Admitting: Family Medicine

## 2021-08-30 DIAGNOSIS — R310 Gross hematuria: Secondary | ICD-10-CM

## 2021-08-31 ENCOUNTER — Ambulatory Visit: Payer: BC Managed Care – PPO | Admitting: Urology

## 2021-08-31 NOTE — Progress Notes (Incomplete)
? ?  08/31/21 ?6:18 AM  ? ?Jamie Cervantes ?1961-01-01 ?[id number] ? ?Referring provider:  ?Juline Patch, MD ?[address] ?[address] ?[place],  [place] [address] ?No chief complaint on file. ? ? ? ? ?HPI: ?Jamie Cervantes is a 61 y.o.female who presents today for further evaluation for microscopic hematuria.  ? ?She was seen by her PCP, Dr Ronnald Ramp, on 08/23/2021. She was noted to have ongoing microscopic hematuria that is accompanied with dysuria. POCT UA showed large blood but was otherwise unremarkable. She was referred to urology.  ? ? ? ? ? ?PMH: ?Past Medical History:  ?Diagnosis Date  ? Complication of anesthesia   ? Hypertension   ? Wears dentures   ? ? ?Surgical History: ?Past Surgical History:  ?Procedure Laterality Date  ? ABDOMINAL HYSTERECTOMY    ? partial  ? BREAST CYST ASPIRATION Right   ? neg  ? COLONOSCOPY WITH PROPOFOL N/A 10/30/2020  ? Procedure: COLONOSCOPY WITH PROPOFOL;  Surgeon: Lucilla Lame, MD;  Location: [facility];  Service: Endoscopy;  Laterality: N/A;  1 ML ELEVIEW INJECTED IN APPENDIX/ CECUM AREA @ 1021  ? POLYPECTOMY N/A 10/30/2020  ? Procedure: POLYPECTOMY;  Surgeon: Lucilla Lame, MD;  Location: [facility];  Service: Endoscopy;  Laterality: N/A;  ? toe spurs    ? ? ?Home Medications:  ?Allergies as of 08/31/2021   ? ?   Reactions  ? Penicillins Nausea Only  ? ?  ? ?  ?Medication List  ?  ? ?  ? Accurate as of Aug 31, 2021  6:18 AM. If you have any questions, ask your nurse or doctor.  ?  ?  ? ?  ? ?lisinopril 10 MG tablet ?Commonly known as: ZESTRIL ?Take 10 mg by mouth daily. cardio ?  ?meclizine 12.5 MG tablet ?Commonly known as: ANTIVERT ?Take 1 tablet (12.5 mg total) by mouth 3 (three) times daily as needed for dizziness. ?  ? ?  ? ? ?Allergies:  ?Allergies  ?Allergen Reactions  ? Penicillins Nausea Only  ? ? ?Family History: ?Family History  ?Problem Relation Age of Onset  ? Heart disease Mother   ? Hypertension Mother   ? Breast cancer Neg Hx   ? ? ?Social History:   reports that she has been smoking cigarettes. She has never used smokeless tobacco. She reports current alcohol use. She reports that she does not use drugs. ? ? ?Physical Exam: ?There were no vitals taken for this visit.  ?Constitutional:  Alert and oriented, No acute distress. ?HEENT: [place] AT, moist mucus membranes.  Trachea midline, no masses. ?Cardiovascular: No clubbing, cyanosis, or edema. ?Respiratory: Normal respiratory effort, no increased work of breathing. ?Skin: No rashes, bruises or suspicious lesions. ?Neurologic: Grossly intact, no focal deficits, moving all 4 extremities. ?Psychiatric: Normal mood and affect. ? ?Laboratory Data: ? ?Lab Results  ?Component Value Date  ? CREATININE 0.95 06/25/2021  ? ?No results found for: HGBA1C ? ?Urinalysis ? ? ?Pertinent Imaging: ? ? ? ?Assessment & Plan:   ? ? ?No follow-ups on file. ? ?I,Kailey Littlejohn,acting as a scribe for Hollice Espy, MD.,have documented all relevant documentation on the behalf of Hollice Espy, MD,as directed by  Hollice Espy, MD while in the presence of Hollice Espy, MD. ? ? ?[facility] ?[address] ?[place], [place] [address] ?[phone] ?

## 2021-09-07 ENCOUNTER — Ambulatory Visit: Payer: BC Managed Care – PPO | Admitting: Urology

## 2021-09-07 ENCOUNTER — Other Ambulatory Visit
Admission: RE | Admit: 2021-09-07 | Discharge: 2021-09-07 | Disposition: A | Discharge status: Home / Self Care | Payer: BC Managed Care – PPO | Attending: Urology | Admitting: Urology

## 2021-09-07 VITALS — BP 154/86 | HR 57 | Ht 66.0 in | Wt 145.0 lb

## 2021-09-07 DIAGNOSIS — R310 Gross hematuria: Secondary | ICD-10-CM

## 2021-09-07 DIAGNOSIS — Z720 Tobacco use: Secondary | ICD-10-CM | POA: Diagnosis not present

## 2021-09-07 DIAGNOSIS — R8290 Unspecified abnormal findings in urine: Secondary | ICD-10-CM

## 2021-09-07 LAB — URINALYSIS, COMPLETE (UACMP) WITH MICROSCOPIC
Bilirubin Urine: NEGATIVE
Glucose, UA: NEGATIVE mg/dL
Ketones, ur: NEGATIVE mg/dL
Leukocytes,Ua: NEGATIVE
Nitrite: NEGATIVE
Protein, ur: NEGATIVE mg/dL
Specific Gravity, Urine: 1.015 (ref 1.005–1.030)
WBC, UA: NONE SEEN WBC/hpf (ref 0–5)
pH: 5.5 (ref 5.0–8.0)

## 2021-09-07 NOTE — Progress Notes (Signed)
09/07/21 5:14 PM   Jamie Cervantes 1961/02/26 [id number]  Referring provider:  Juline Patch, MD [address] [place],  [place] [address] Chief Complaint  Patient presents with   Hematuria    HPI: Jamie Cervantes is a 61 y.o.female for further evaluation of asymptomatic microscopic hematuria.   She was seen last year in 07/2020 for microscopic hematuria.   She was seen by her PCP Dr Otilio Miu on 08/23/2021. She was noted to have microscopic hematuria with associated symptom of dysuria. She was s/p UTI that was treated with antibiotics. Urinalysis showed resolved leukocytes but ongoing 3+ dipstick red cells.   She is a current smoker.    PMH: Past Medical History:  Diagnosis Date   Complication of anesthesia    Hypertension    Wears dentures     Surgical History: Past Surgical History:  Procedure Laterality Date   ABDOMINAL HYSTERECTOMY     partial   BREAST CYST ASPIRATION Right    neg   COLONOSCOPY WITH PROPOFOL N/A 10/30/2020   Procedure: COLONOSCOPY WITH PROPOFOL;  Surgeon: Lucilla Lame, MD;  Location: [facility];  Service: Endoscopy;  Laterality: N/A;  1 ML ELEVIEW INJECTED IN APPENDIX/ CECUM AREA @ 1021   POLYPECTOMY N/A 10/30/2020   Procedure: POLYPECTOMY;  Surgeon: Lucilla Lame, MD;  Location: [facility];  Service: Endoscopy;  Laterality: N/A;   toe spurs      Home Medications:  Allergies as of 09/07/2021       Reactions   Penicillins Nausea Only        Medication List        Accurate as of Sep 07, 2021 11:59 PM. If you have any questions, ask your nurse or doctor.          STOP taking these medications    meclizine 12.5 MG tablet Commonly known as: ANTIVERT       TAKE these medications    hydrochlorothiazide 12.5 MG tablet Commonly known as: HYDRODIURIL Take 12.5 mg by mouth daily.   lisinopril 10 MG tablet Commonly known as: ZESTRIL Take 10 mg by mouth daily. cardio        Allergies:   Allergies  Allergen Reactions   Penicillins Nausea Only    Family History: Family History  Problem Relation Age of Onset   Heart disease Mother    Hypertension Mother    Breast cancer Neg Hx     Social History:  reports that she has been smoking cigarettes. She has never used smokeless tobacco. She reports current alcohol use. She reports that she does not use drugs.   Physical Exam: BP (!) 154/86   Pulse (!) 57   Ht '5\' 6"'$  (1.676 m)   Wt 145 lb (65.8 kg)   BMI 23.40 kg/m   Constitutional:  Alert and oriented, No acute distress. HEENT: [place] AT, moist mucus membranes.  Trachea midline, no masses. Cardiovascular: No clubbing, cyanosis, or edema. Respiratory: Normal respiratory effort, no increased work of breathing. Skin: No rashes, bruises or suspicious lesions. Neurologic: Grossly intact, no focal deficits, moving all 4 extremities. Psychiatric: Normal mood and affect.  Laboratory Data:  Lab Results  Component Value Date   CREATININE 0.95 06/25/2021   Results for orders placed or performed during the hospital encounter of 09/07/21  Urinalysis, Complete w Microscopic  Result Value Ref Range   Color, Urine YELLOW YELLOW   APPearance CLEAR CLEAR   Specific Gravity, Urine 1.015 1.005 - 1.030  pH 5.5 5.0 - 8.0   Glucose, UA NEGATIVE NEGATIVE mg/dL   Hgb urine dipstick MODERATE (A) NEGATIVE   Bilirubin Urine NEGATIVE NEGATIVE   Ketones, ur NEGATIVE NEGATIVE mg/dL   Protein, ur NEGATIVE NEGATIVE mg/dL   Nitrite NEGATIVE NEGATIVE   Leukocytes,Ua NEGATIVE NEGATIVE   Squamous Epithelial / LPF 0-5 0 - 5   WBC, UA NONE SEEN 0 - 5 WBC/hpf   RBC / HPF 0-5 0 - 5 RBC/hpf   Bacteria, UA RARE (A) NONE SEEN      Assessment & Plan:   1. Abnormal urinalysis - No evidence of microscopic blood today - We discussed the difference between urinary dipstick and microscopic urinalysis as it relates to blood in the urine.  We discussed that it a dipstick is a chemical reaction that  can have false positives - At this point in time, she does not meet the criteria for microscopic hematuria evaluation.  If she did meet the criteria, she would be considered high risk given her history of smoking and age.  2.  Tobacco abuse -Lengthy discussion today about causative relationship to bladder cancer and smoking, encourage cessation  As needed  Conley Rolls as a scribe for Hollice Espy, MD.,have documented all relevant documentation on the behalf of Hollice Espy, MD,as directed by  Hollice Espy, MD while in the presence of Hollice Espy, MD.  I have reviewed the above documentation for accuracy and completeness, and I agree with the above.   Hollice Espy, MD    [facility] [address] [place], [place] [address] [phone]

## 2021-09-12 ENCOUNTER — Ambulatory Visit: Payer: BC Managed Care – PPO

## 2021-09-19 ENCOUNTER — Inpatient Hospital Stay: Admission: RE | Admit: 2021-09-19 | Payer: BC Managed Care – PPO | Source: Ambulatory Visit

## 2021-12-31 ENCOUNTER — Ambulatory Visit: Payer: BC Managed Care – PPO | Admitting: Family Medicine

## 2022-01-07 ENCOUNTER — Encounter: Payer: Self-pay | Admitting: Family Medicine

## 2022-08-15 ENCOUNTER — Encounter: Payer: BC Managed Care – PPO | Admitting: Family Medicine

## 2022-08-27 ENCOUNTER — Encounter: Payer: BC Managed Care – PPO | Admitting: Family Medicine

## 2022-09-02 ENCOUNTER — Ambulatory Visit: Payer: BC Managed Care – PPO | Admitting: Family Medicine

## 2022-09-10 ENCOUNTER — Encounter: Payer: Self-pay | Admitting: Family Medicine

## 2022-09-13 DIAGNOSIS — S96912A Strain of unspecified muscle and tendon at ankle and foot level, left foot, initial encounter: Secondary | ICD-10-CM | POA: Diagnosis not present

## 2022-09-13 DIAGNOSIS — R42 Dizziness and giddiness: Secondary | ICD-10-CM | POA: Diagnosis not present

## 2022-09-13 DIAGNOSIS — R110 Nausea: Secondary | ICD-10-CM | POA: Diagnosis not present

## 2022-09-13 DIAGNOSIS — R232 Flushing: Secondary | ICD-10-CM | POA: Diagnosis not present

## 2022-09-13 DIAGNOSIS — R197 Diarrhea, unspecified: Secondary | ICD-10-CM | POA: Diagnosis not present

## 2022-09-13 DIAGNOSIS — Z880 Allergy status to penicillin: Secondary | ICD-10-CM | POA: Diagnosis not present

## 2022-09-13 DIAGNOSIS — R112 Nausea with vomiting, unspecified: Secondary | ICD-10-CM | POA: Diagnosis not present

## 2022-09-13 DIAGNOSIS — W1842XA Slipping, tripping and stumbling without falling due to stepping into hole or opening, initial encounter: Secondary | ICD-10-CM | POA: Diagnosis not present

## 2022-09-13 DIAGNOSIS — S99912A Unspecified injury of left ankle, initial encounter: Secondary | ICD-10-CM | POA: Diagnosis not present

## 2022-09-13 DIAGNOSIS — I10 Essential (primary) hypertension: Secondary | ICD-10-CM | POA: Diagnosis not present

## 2022-09-13 DIAGNOSIS — S93402A Sprain of unspecified ligament of left ankle, initial encounter: Secondary | ICD-10-CM | POA: Diagnosis not present

## 2022-09-13 DIAGNOSIS — R0689 Other abnormalities of breathing: Secondary | ICD-10-CM | POA: Diagnosis not present

## 2022-09-13 DIAGNOSIS — F17210 Nicotine dependence, cigarettes, uncomplicated: Secondary | ICD-10-CM | POA: Diagnosis not present

## 2022-09-13 DIAGNOSIS — R61 Generalized hyperhidrosis: Secondary | ICD-10-CM | POA: Diagnosis not present

## 2022-09-15 DIAGNOSIS — S93402A Sprain of unspecified ligament of left ankle, initial encounter: Secondary | ICD-10-CM | POA: Diagnosis not present

## 2022-09-17 ENCOUNTER — Telehealth: Payer: Self-pay

## 2022-09-17 ENCOUNTER — Ambulatory Visit: Payer: BC Managed Care – PPO | Admitting: Family Medicine

## 2022-09-17 ENCOUNTER — Encounter: Payer: Self-pay | Admitting: Family Medicine

## 2022-09-17 VITALS — BP 150/100 | HR 66 | Ht 66.0 in | Wt 154.0 lb

## 2022-09-17 DIAGNOSIS — I10 Essential (primary) hypertension: Secondary | ICD-10-CM | POA: Diagnosis not present

## 2022-09-17 DIAGNOSIS — F17210 Nicotine dependence, cigarettes, uncomplicated: Secondary | ICD-10-CM | POA: Diagnosis not present

## 2022-09-17 DIAGNOSIS — E7801 Familial hypercholesterolemia: Secondary | ICD-10-CM | POA: Diagnosis not present

## 2022-09-17 DIAGNOSIS — S93492D Sprain of other ligament of left ankle, subsequent encounter: Secondary | ICD-10-CM | POA: Diagnosis not present

## 2022-09-17 MED ORDER — LISINOPRIL 10 MG PO TABS: 10.0000 mg | ORAL_TABLET | Freq: Every day | ORAL | 0 refills | Status: DC

## 2022-09-17 NOTE — Transitions of Care (Post Inpatient/ED Visit) (Signed)
   09/17/2022  Name: Jamie Cervantes MRN: [id number] DOB: 1960/09/05  Today's TOC FU Call Status: Today's TOC FU Call Status:: Successful TOC FU Call Competed TOC FU Call Complete Date: 09/17/22  Transition Care Management Follow-up Telephone Call Date of Discharge: 09/13/22 Discharge Facility: Other ([facility] Facility) Name of Other ([facility]) Discharge Facility: [facility] Type of Discharge: Emergency Department How have you been since you were released from the hospital?: Better Any questions or concerns?: No  Items Reviewed: Did you receive and understand the discharge instructions provided?: Yes Medications obtained,verified, and reconciled?: Yes (Medications Reviewed) Any new allergies since your discharge?: No Dietary orders reviewed?: No Do you have support at home?: Yes People in Home: sibling(s)  Medications Reviewed Today: Medications Reviewed Today     Reviewed by Vanna Scotland, MD (Physician) on 09/10/21 at 1714  Med List Status: <None>   Medication Order Taking? Sig Documenting Provider Last Dose Status Informant  hydrochlorothiazide (HYDRODIURIL) 12.5 MG tablet [id number] Yes Take 12.5 mg by mouth daily. [provider] Taking Active   lisinopril (ZESTRIL) 10 MG tablet [id number] Yes Take 10 mg by mouth daily. cardio [provider] Taking Active             Home Care and Equipment/Supplies: Were Home Health Services Ordered?: No Any new equipment or medical supplies ordered?: No  Functional Questionnaire: Do you need assistance with bathing/showering or dressing?: No Do you need assistance with meal preparation?: No Do you need assistance with eating?: No Do you have difficulty maintaining continence: No Do you need assistance with getting out of bed/getting out of a chair/moving?: No Do you have difficulty managing or taking your medications?: No  Follow up appointments reviewed: PCP Follow-up appointment confirmed?: Yes Date of PCP  follow-up appointment?: 09/17/22 Follow-up Provider: Dub Amis Specialist [facility] Follow-up appointment confirmed?: NA Do you need transportation to your follow-up appointment?: No Do you understand care options if your condition(s) worsen?: Yes-patient verbalized understanding    SIGNATURE [name]

## 2022-09-17 NOTE — Patient Instructions (Signed)

## 2022-09-17 NOTE — Progress Notes (Signed)
Date:  09/17/2022   Name:  Jamie Cervantes   DOB:  July 21, 1960   MRN:  [id number]   Chief Complaint: Er follow up Micah Flesher on 5/24 to ER for a sprained ankle after falling around 5-530 in the afternoon. Thinks she stepped in a hole and twisted L) ankle. Seen in ER and TOC call placed this morning prior to appt. Wrapped ankle in ace wrap- "feels a lot better")  Hypertension This is a chronic problem. The current episode started more than 1 year ago. The problem has been gradually improving since onset. The problem is controlled. Pertinent negatives include no chest pain, orthopnea, palpitations, PND or shortness of breath. There are no associated agents to hypertension. There are no known risk factors for coronary artery disease. Past treatments include ACE inhibitors. The current treatment provides mild improvement. There are no compliance problems.  There is no history of angina, kidney disease, CAD/MI, CVA, heart failure, left ventricular hypertrophy, PVD or retinopathy. There is no history of chronic renal disease, a hypertension causing med or renovascular disease.    Lab Results  Component Value Date   NA 142 06/25/2021   K 4.0 06/25/2021   CO2 23 06/25/2021   GLUCOSE 98 06/25/2021   BUN 10 06/25/2021   CREATININE 0.95 06/25/2021   CALCIUM 9.8 06/25/2021   EGFR 68 06/25/2021   GFRNONAA 56 (L) 08/17/2019   Lab Results  Component Value Date   CHOL 183 06/25/2021   HDL 45 06/25/2021   LDLCALC 122 (H) 06/25/2021   TRIG 84 06/25/2021   No results found for: "TSH" No results found for: "HGBA1C" Lab Results  Component Value Date   WBC 10.0 08/20/2021   HGB 13.6 08/20/2021   HCT 42.1 08/20/2021   MCV 86 08/20/2021   PLT 262 08/20/2021   Lab Results  Component Value Date   ALT 19 08/07/2020   AST 21 08/07/2020   ALKPHOS 146 (H) 08/07/2020   BILITOT 0.5 08/07/2020   No results found for: "25OHVITD2", "25OHVITD3", "VD25OH"   Review of Systems  HENT:  Negative for trouble  swallowing.   Eyes:  Negative for visual disturbance.  Respiratory:  Negative for chest tightness, shortness of breath and wheezing.   Cardiovascular:  Negative for chest pain, palpitations, orthopnea, leg swelling and PND.  Genitourinary:  Negative for difficulty urinating.    Patient Active Problem List   Diagnosis Date Noted  . Colon cancer screening   . Polyp of colon   . H/O total hysterectomy 08/07/2020    Allergies  Allergen Reactions  . Penicillins Nausea Only    Past Surgical History:  Procedure Laterality Date  . ABDOMINAL HYSTERECTOMY     partial  . BREAST CYST ASPIRATION Right    neg  . COLONOSCOPY WITH PROPOFOL N/A 10/30/2020   Procedure: COLONOSCOPY WITH PROPOFOL;  Surgeon: Midge Minium, MD;  Location: [facility];  Service: Endoscopy;  Laterality: N/A;  1 ML ELEVIEW INJECTED IN APPENDIX/ CECUM AREA @ 1021  . POLYPECTOMY N/A 10/30/2020   Procedure: POLYPECTOMY;  Surgeon: Midge Minium, MD;  Location: [facility];  Service: Endoscopy;  Laterality: N/A;  . toe spurs      Social History   Tobacco Use  . Smoking status: Every Day    Types: Cigarettes  . Smokeless tobacco: Never  . Tobacco comments:    discussed smoking cessation- pt refuses/ has own plan  Vaping Use  . Vaping Use: Never used  Substance Use Topics  .  Alcohol use: Yes    Comment: occasionally  . Drug use: Never     Medication list has been reviewed and updated.  Current Meds  Medication Sig  . lisinopril (ZESTRIL) 10 MG tablet Take 10 mg by mouth daily. cardiology       09/17/2022    1:20 PM 08/23/2021    8:14 AM 08/20/2021    8:45 AM 05/24/2021   11:42 AM  GAD 7 : Generalized Anxiety Score  Nervous, Anxious, on Edge 0 0 0 0  Control/stop worrying 0 0 0 0  Worry too much - different things 0 0 0 0  Trouble relaxing 0 0 0 0  Restless 0 0 0 0  Easily annoyed or irritable 0 0 0 0  Afraid - awful might happen 0 0 0 0  Total GAD 7 Score 0 0 0 0  Anxiety Difficulty Not  difficult at all  Not difficult at all Not difficult at all       09/17/2022    1:20 PM 08/23/2021    8:14 AM 08/20/2021    8:45 AM  Depression screen PHQ 2/9  Decreased Interest 0 2 0  Down, Depressed, Hopeless 0 1 0  PHQ - 2 Score 0 3 0  Altered sleeping 0 0 0  Tired, decreased energy 0 0 0  Change in appetite 0 0 0  Feeling bad or failure about yourself  0 0 0  Trouble concentrating 0 0 0  Moving slowly or fidgety/restless 0 0 0  Suicidal thoughts 0 0 0  PHQ-9 Score 0 3 0  Difficult doing work/chores Not difficult at all Not difficult at all Not difficult at all    BP Readings from Last 3 Encounters:  09/17/22 (!) 150/100  09/07/21 (!) 154/86  08/23/21 (!) 130/100    Physical Exam Vitals and nursing note reviewed. Exam conducted with a chaperone present.  Constitutional:      General: She is not in acute distress.    Appearance: She is not diaphoretic.  HENT:     Head: Normocephalic and atraumatic.     Right Ear: Tympanic membrane and external ear normal.     Left Ear: Tympanic membrane and external ear normal.     Nose: Nose normal.     Mouth/Throat:     Mouth: Mucous membranes are dry.  Eyes:     General:        Right eye: No discharge.        Left eye: No discharge.     Conjunctiva/sclera: Conjunctivae normal.     Pupils: Pupils are equal, round, and reactive to light.  Neck:     Thyroid: No thyromegaly.     Vascular: No JVD.  Cardiovascular:     Rate and Rhythm: Normal rate and regular rhythm.     Heart sounds: Normal heart sounds. No murmur heard.    No friction rub. No gallop.  Pulmonary:     Effort: Pulmonary effort is normal.     Breath sounds: Normal breath sounds. No wheezing, rhonchi or rales.  Abdominal:     General: Bowel sounds are normal.     Palpations: Abdomen is soft. There is no mass.     Tenderness: There is no abdominal tenderness. There is no guarding or rebound.  Musculoskeletal:        General: Normal range of motion.     Cervical  back: Normal range of motion and neck supple.     Left ankle: Tenderness  present over the ATF ligament and CF ligament.  Lymphadenopathy:     Cervical: No cervical adenopathy.  Skin:    General: Skin is warm and dry.  Neurological:     Mental Status: She is alert.     Deep Tendon Reflexes: Reflexes are normal and symmetric.    Wt Readings from Last 3 Encounters:  09/17/22 154 lb (69.9 kg)  09/07/21 145 lb (65.8 kg)  08/23/21 143 lb (64.9 kg)    BP (!) 150/100 (BP Location: Right Arm, Cuff Size: Large)   Pulse 66   Ht 5\' 6"  (1.676 m)   Wt 154 lb (69.9 kg)   SpO2 94%   BMI 24.86 kg/m   Assessment and Plan:  1. Primary hypertension Chronic.  Uncontrolled.  Stable.  Blood pressure today 150/100.  Patient used to be on combination lisinopril hydrochlorothiazide 10/12.5 mg however when she was dehydrated over a year ago we stopped the blood pressure medication hydrochlorothiazide and continue the lisinopril and patient was rechecked and was normal but was to come back patient has not been seen in over a year and has been getting her blood pressure medicine elsewhere.  In the meantime patient had an ankle sprain it was noted to be elevated but this is without medicine and this is the first that patient has been reevaluated having taken her lisinopril earlier today but having a nicotine event prior to coming to the doctor.  My suggestion is that we continue with just the lisinopril hydrochlorothiazide and that her mucous membranes seem to be dry and it may be accentuated by the hydrochlorothiazide.  We will recheck patient on Thursday and if elevated decide on whether or not we continue with just lisinopril or add amlodipine 2.5 mg.  2. Familial hypercholesterolemia Chronic.  Controlled.  Stable.  Been elevated in the past patient will come fasting and we will check her lipids at that time.  In the meantime patient has been given a low-cholesterol triglyceride dietary guidelines.  3.  Cigarette nicotine dependence without complication Patient has been advised of the health risks of smoking and counseled concerning cessation of tobacco products. I spent over 3 minutes for discussion and to answer questions.   4. Sprain of anterior talofibular ligament of left ankle, subsequent encounter Patient is follow-up for an ankle sprain that was evaluated emergency room.  There is decreased swelling and pain at this time and she is alternating ibuprofen and Tylenol.  When we recheck her on Thursday we have asked her only to take Tylenol so this would not be having any ibuprofen on board when we recheck her blood pressure on Thursday morning at 9:00.   Elizabeth Sauer, MD

## 2022-09-19 ENCOUNTER — Encounter: Payer: Self-pay | Admitting: Family Medicine

## 2022-09-19 ENCOUNTER — Ambulatory Visit: Payer: BC Managed Care – PPO | Admitting: Family Medicine

## 2022-09-19 VITALS — BP 130/100 | HR 93 | Ht 66.0 in | Wt 157.0 lb

## 2022-09-19 DIAGNOSIS — I10 Essential (primary) hypertension: Secondary | ICD-10-CM | POA: Diagnosis not present

## 2022-09-19 DIAGNOSIS — E7801 Familial hypercholesterolemia: Secondary | ICD-10-CM | POA: Diagnosis not present

## 2022-09-19 DIAGNOSIS — F17210 Nicotine dependence, cigarettes, uncomplicated: Secondary | ICD-10-CM

## 2022-09-19 MED ORDER — AMLODIPINE BESYLATE 2.5 MG PO TABS
2.5000 mg | ORAL_TABLET | Freq: Every day | ORAL | 0 refills | Status: DC
Start: 2022-09-19 — End: 2022-12-20

## 2022-09-19 NOTE — Progress Notes (Signed)
Date:  09/19/2022   Name:  BRANIAH KAPLOWITZ   DOB:  19-Oct-1960   MRN:  [id number]   Chief Complaint: Hypertension  Hypertension This is a chronic problem. The current episode started more than 1 year ago. The problem has been waxing and waning since onset. The problem is uncontrolled. Pertinent negatives include no chest pain, headaches, orthopnea, palpitations, PND or shortness of breath. Past treatments include ACE inhibitors. The current treatment provides moderate improvement. There are no compliance problems.  There is no history of kidney disease, CAD/MI, CVA or heart failure. There is no history of chronic renal disease, a hypertension causing med or renovascular disease.    Lab Results  Component Value Date   NA 142 06/25/2021   K 4.0 06/25/2021   CO2 23 06/25/2021   GLUCOSE 98 06/25/2021   BUN 10 06/25/2021   CREATININE 0.95 06/25/2021   CALCIUM 9.8 06/25/2021   EGFR 68 06/25/2021   GFRNONAA 56 (L) 08/17/2019   Lab Results  Component Value Date   CHOL 183 06/25/2021   HDL 45 06/25/2021   LDLCALC 122 (H) 06/25/2021   TRIG 84 06/25/2021   No results found for: "TSH" No results found for: "HGBA1C" Lab Results  Component Value Date   WBC 10.0 08/20/2021   HGB 13.6 08/20/2021   HCT 42.1 08/20/2021   MCV 86 08/20/2021   PLT 262 08/20/2021   Lab Results  Component Value Date   ALT 19 08/07/2020   AST 21 08/07/2020   ALKPHOS 146 (H) 08/07/2020   BILITOT 0.5 08/07/2020   No results found for: "25OHVITD2", "25OHVITD3", "VD25OH"   Review of Systems  Constitutional:  Negative for unexpected weight change.  HENT:  Negative for postnasal drip, sinus pressure and sinus pain.   Eyes:  Negative for visual disturbance.  Respiratory:  Negative for cough, chest tightness, shortness of breath and wheezing.   Cardiovascular:  Negative for chest pain, palpitations, orthopnea and PND.  Gastrointestinal:  Negative for abdominal pain, blood in stool and constipation.   Genitourinary:  Negative for difficulty urinating and vaginal bleeding.  Neurological:  Negative for headaches.  All other systems reviewed and are negative.   Patient Active Problem List   Diagnosis Date Noted   Colon cancer screening    Polyp of colon    H/O total hysterectomy 08/07/2020    Allergies  Allergen Reactions   Penicillins Nausea Only    Past Surgical History:  Procedure Laterality Date   ABDOMINAL HYSTERECTOMY     partial   BREAST CYST ASPIRATION Right    neg   COLONOSCOPY WITH PROPOFOL N/A 10/30/2020   Procedure: COLONOSCOPY WITH PROPOFOL;  Surgeon: Midge Minium, MD;  Location: [facility];  Service: Endoscopy;  Laterality: N/A;  1 ML ELEVIEW INJECTED IN APPENDIX/ CECUM AREA @ 1021   POLYPECTOMY N/A 10/30/2020   Procedure: POLYPECTOMY;  Surgeon: Midge Minium, MD;  Location: [facility];  Service: Endoscopy;  Laterality: N/A;   toe spurs      Social History   Tobacco Use   Smoking status: Every Day    Types: Cigarettes   Smokeless tobacco: Never   Tobacco comments:    discussed smoking cessation- pt refuses/ has own plan  Vaping Use   Vaping Use: Never used  Substance Use Topics   Alcohol use: Yes    Comment: occasionally   Drug use: Never     Medication list has been reviewed and updated.  Current Meds  Medication Sig  lisinopril (ZESTRIL) 10 MG tablet Take 1 tablet (10 mg total) by mouth daily. cardiology       09/19/2022    9:05 AM 09/17/2022    1:20 PM 08/23/2021    8:14 AM 08/20/2021    8:45 AM  GAD 7 : Generalized Anxiety Score  Nervous, Anxious, on Edge 0 0 0 0  Control/stop worrying 0 0 0 0  Worry too much - different things 0 0 0 0  Trouble relaxing 0 0 0 0  Restless 0 0 0 0  Easily annoyed or irritable 0 0 0 0  Afraid - awful might happen 0 0 0 0  Total GAD 7 Score 0 0 0 0  Anxiety Difficulty Not difficult at all Not difficult at all  Not difficult at all       09/19/2022    9:05 AM 09/17/2022    1:20 PM  08/23/2021    8:14 AM  Depression screen PHQ 2/9  Decreased Interest 0 0 2  Down, Depressed, Hopeless 0 0 1  PHQ - 2 Score 0 0 3  Altered sleeping 0 0 0  Tired, decreased energy 0 0 0  Change in appetite 0 0 0  Feeling bad or failure about yourself  0 0 0  Trouble concentrating 0 0 0  Moving slowly or fidgety/restless 0 0 0  Suicidal thoughts 0 0 0  PHQ-9 Score 0 0 3  Difficult doing work/chores Not difficult at all Not difficult at all Not difficult at all    BP Readings from Last 3 Encounters:  09/19/22 (!) 130/100  09/17/22 (!) 150/100  09/07/21 (!) 154/86    Physical Exam HENT:     Right Ear: Tympanic membrane, ear canal and external ear normal.     Left Ear: Tympanic membrane, ear canal and external ear normal.     Nose: Nose normal.     Mouth/Throat:     Mouth: Mucous membranes are moist.  Eyes:     Pupils: Pupils are equal, round, and reactive to light.  Cardiovascular:     Rate and Rhythm: Normal rate and regular rhythm.     Heart sounds: Normal heart sounds. No murmur heard.    No gallop.  Pulmonary:     Effort: Prolonged expiration present.     Breath sounds: Decreased air movement present. No decreased breath sounds, wheezing, rhonchi or rales.  Abdominal:     Palpations: Abdomen is soft.     Tenderness: There is no guarding or rebound.     Wt Readings from Last 3 Encounters:  09/19/22 157 lb (71.2 kg)  09/17/22 154 lb (69.9 kg)  09/07/21 145 lb (65.8 kg)    BP (!) 130/100   Pulse 93   Ht 5\' 6"  (1.676 m)   Wt 157 lb (71.2 kg)   SpO2 95%   BMI 25.34 kg/m   Assessment and Plan: 1. Primary hypertension Chronic.  Uncontrolled.  Stable.  Blood pressure 130/100.  Patient returns today having not smoked and never taken medication prior to evaluation which includes lisinopril 10 mg once a day.  Blood pressure remains elevated and therefore we will add amlodipine 2.5 mg to this regimen and will recheck in 6 weeks.  In the meantime we will check CMP for  electrolytes and GFR. - Comprehensive Metabolic Panel (CMET) - amLODipine (NORVASC) 2.5 MG tablet; Take 1 tablet (2.5 mg total) by mouth daily.  Dispense: 90 tablet; Refill: 0  2. Familial hypercholesterolemia Chronic.  Controlled.  Patient is diet controlled and has elevated LDLs in the past.  Patient is fasting this morning we will check lipid panel for current status of LDL. - Lipid Panel With LDL/HDL Ratio  3. Cigarette nicotine dependence without complication Patient has been advised of the health risks of smoking and counseled concerning cessation of tobacco products. I spent over 3 minutes for discussion and to answer questions.        Elizabeth Sauer, MD

## 2022-09-20 ENCOUNTER — Other Ambulatory Visit: Payer: Self-pay

## 2022-09-20 DIAGNOSIS — E7801 Familial hypercholesterolemia: Secondary | ICD-10-CM

## 2022-09-20 LAB — COMPREHENSIVE METABOLIC PANEL
ALT: 23 IU/L (ref 0–32)
AST: 26 IU/L (ref 0–40)
Albumin/Globulin Ratio: 1.7 (ref 1.2–2.2)
Albumin: 4.6 g/dL (ref 3.9–4.9)
Alkaline Phosphatase: 141 IU/L — ABNORMAL HIGH (ref 44–121)
BUN/Creatinine Ratio: 11 — ABNORMAL LOW (ref 12–28)
BUN: 11 mg/dL (ref 8–27)
Bilirubin Total: 0.3 mg/dL (ref 0.0–1.2)
CO2: 22 mmol/L (ref 20–29)
Calcium: 10 mg/dL (ref 8.7–10.3)
Chloride: 106 mmol/L (ref 96–106)
Creatinine, Ser: 0.99 mg/dL (ref 0.57–1.00)
Globulin, Total: 2.7 g/dL (ref 1.5–4.5)
Glucose: 90 mg/dL (ref 70–99)
Potassium: 4.9 mmol/L (ref 3.5–5.2)
Sodium: 143 mmol/L (ref 134–144)
Total Protein: 7.3 g/dL (ref 6.0–8.5)
eGFR: 64 mL/min/{1.73_m2} (ref >59)

## 2022-09-20 LAB — LIPID PANEL WITH LDL/HDL RATIO
Cholesterol, Total: 213 mg/dL — ABNORMAL HIGH (ref 100–199)
HDL: 60 mg/dL (ref >39)
LDL Chol Calc (NIH): 140 mg/dL — ABNORMAL HIGH (ref 0–99)
LDL/HDL Ratio: 2.3 ratio (ref 0.0–3.2)
Triglycerides: 72 mg/dL (ref 0–149)
VLDL Cholesterol Cal: 13 mg/dL (ref 5–40)

## 2022-09-20 MED ORDER — ATORVASTATIN CALCIUM 10 MG PO TABS: 10.0000 mg | ORAL_TABLET | Freq: Every day | ORAL | 1 refills | Status: DC

## 2022-09-30 ENCOUNTER — Other Ambulatory Visit: Payer: Self-pay | Admitting: Family Medicine

## 2022-09-30 DIAGNOSIS — I10 Essential (primary) hypertension: Secondary | ICD-10-CM

## 2022-10-01 NOTE — Telephone Encounter (Signed)
Requested Prescriptions  Refused Prescriptions Disp Refills   amLODipine (NORVASC) 2.5 MG tablet [Pharmacy Med Name: AMLODIPINE BESYLATE 2.5 MG TAB] 90 tablet 0    Sig: TAKE 1 TABLET BY MOUTH EVERY DAY     Cardiovascular: Calcium Channel Blockers 2 Failed - 09/30/2022  9:14 AM      Failed - Last BP in normal range    BP Readings from Last 1 Encounters:  09/19/22 (!) 130/100         Passed - Last Heart Rate in normal range    Pulse Readings from Last 1 Encounters:  09/19/22 93         Passed - Valid encounter within last 6 months    Recent Outpatient Visits           1 week ago Primary hypertension   [facility]   2 weeks ago Primary hypertension   [facility]   1 year ago Asymptomatic microscopic hematuria   [facility]   1 year ago Annual physical exam   [facility]   1 year ago Erroneous encounter - disregard   [facility]       Future Appointments             In 1 month Duanne Limerick, MD [facility] at [facility], [facility]

## 2022-10-04 ENCOUNTER — Ambulatory Visit: Payer: Self-pay

## 2022-10-04 ENCOUNTER — Encounter: Payer: Self-pay | Admitting: Family Medicine

## 2022-10-04 ENCOUNTER — Ambulatory Visit: Payer: BC Managed Care – PPO | Admitting: Family Medicine

## 2022-10-04 VITALS — BP 150/100 | HR 68 | Ht 66.0 in | Wt 154.0 lb

## 2022-10-04 DIAGNOSIS — I10 Essential (primary) hypertension: Secondary | ICD-10-CM | POA: Diagnosis not present

## 2022-10-04 DIAGNOSIS — J0100 Acute maxillary sinusitis, unspecified: Secondary | ICD-10-CM

## 2022-10-04 DIAGNOSIS — R051 Acute cough: Secondary | ICD-10-CM

## 2022-10-04 MED ORDER — PROMETHAZINE-DM 6.25-15 MG/5ML PO SYRP
5.0000 mL | ORAL_SOLUTION | Freq: Four times a day (QID) | ORAL | 0 refills | Status: DC | PRN
Start: 2022-10-04 — End: 2023-04-08

## 2022-10-04 MED ORDER — DOXYCYCLINE HYCLATE 100 MG PO TABS
100.0000 mg | ORAL_TABLET | Freq: Two times a day (BID) | ORAL | 0 refills | Status: DC
Start: 2022-10-04 — End: 2023-04-08

## 2022-10-04 NOTE — Progress Notes (Signed)
Date:  10/04/2022   Name:  Jamie Cervantes   DOB:  March 23, 1961   MRN:  [id number]   Chief Complaint: Cough (Cough with yellow production, drainage- started 1 week ago. )  Cough This is a new problem. The current episode started in the past 7 days. The problem has been waxing and waning. The cough is Productive of purulent sputum. Associated symptoms include shortness of breath and wheezing. Pertinent negatives include no chest pain, chills, ear congestion, fever, heartburn, nasal congestion, postnasal drip, rash or sore throat. Nothing aggravates the symptoms. She has tried OTC cough suppressant for the symptoms. The treatment provided mild relief.    Lab Results  Component Value Date   NA 143 09/19/2022   K 4.9 09/19/2022   CO2 22 09/19/2022   GLUCOSE 90 09/19/2022   BUN 11 09/19/2022   CREATININE 0.99 09/19/2022   CALCIUM 10.0 09/19/2022   EGFR 64 09/19/2022   GFRNONAA 56 (L) 08/17/2019   Lab Results  Component Value Date   CHOL 213 (H) 09/19/2022   HDL 60 09/19/2022   LDLCALC 140 (H) 09/19/2022   TRIG 72 09/19/2022   No results found for: "TSH" No results found for: "HGBA1C" Lab Results  Component Value Date   WBC 10.0 08/20/2021   HGB 13.6 08/20/2021   HCT 42.1 08/20/2021   MCV 86 08/20/2021   PLT 262 08/20/2021   Lab Results  Component Value Date   ALT 23 09/19/2022   AST 26 09/19/2022   ALKPHOS 141 (H) 09/19/2022   BILITOT 0.3 09/19/2022   No results found for: "25OHVITD2", "25OHVITD3", "VD25OH"   Review of Systems  Constitutional:  Negative for chills and fever.  HENT:  Negative for postnasal drip and sore throat.   Respiratory:  Positive for cough, shortness of breath and wheezing.   Cardiovascular:  Negative for chest pain, palpitations and leg swelling.  Gastrointestinal:  Negative for blood in stool and heartburn.  Skin:  Negative for rash.    Patient Active Problem List   Diagnosis Date Noted   Colon cancer screening    Polyp of colon    H/O  total hysterectomy 08/07/2020    Allergies  Allergen Reactions   Penicillins Nausea Only    Past Surgical History:  Procedure Laterality Date   ABDOMINAL HYSTERECTOMY     partial   BREAST CYST ASPIRATION Right    neg   COLONOSCOPY WITH PROPOFOL N/A 10/30/2020   Procedure: COLONOSCOPY WITH PROPOFOL;  Surgeon: Midge Minium, MD;  Location: [facility];  Service: Endoscopy;  Laterality: N/A;  1 ML ELEVIEW INJECTED IN APPENDIX/ CECUM AREA @ 1021   POLYPECTOMY N/A 10/30/2020   Procedure: POLYPECTOMY;  Surgeon: Midge Minium, MD;  Location: [facility];  Service: Endoscopy;  Laterality: N/A;   toe spurs      Social History   Tobacco Use   Smoking status: Every Day    Types: Cigarettes   Smokeless tobacco: Never   Tobacco comments:    discussed smoking cessation- pt refuses/ has own plan  Vaping Use   Vaping Use: Never used  Substance Use Topics   Alcohol use: Yes    Comment: occasionally   Drug use: Never     Medication list has been reviewed and updated.  Current Meds  Medication Sig   amLODipine (NORVASC) 2.5 MG tablet Take 1 tablet (2.5 mg total) by mouth daily.   atorvastatin (LIPITOR) 10 MG tablet Take 1 tablet (10 mg total) by mouth  daily.   lisinopril (ZESTRIL) 10 MG tablet Take 1 tablet (10 mg total) by mouth daily. cardiology       10/04/2022    3:51 PM 09/19/2022    9:05 AM 09/17/2022    1:20 PM 08/23/2021    8:14 AM  GAD 7 : Generalized Anxiety Score  Nervous, Anxious, on Edge 0 0 0 0  Control/stop worrying 0 0 0 0  Worry too much - different things 0 0 0 0  Trouble relaxing 0 0 0 0  Restless 0 0 0 0  Easily annoyed or irritable 0 0 0 0  Afraid - awful might happen 0 0 0 0  Total GAD 7 Score 0 0 0 0  Anxiety Difficulty Not difficult at all Not difficult at all Not difficult at all        10/04/2022    3:51 PM 09/19/2022    9:05 AM 09/17/2022    1:20 PM  Depression screen PHQ 2/9  Decreased Interest 0 0 0  Down, Depressed, Hopeless 0 0 0   PHQ - 2 Score 0 0 0  Altered sleeping 0 0 0  Tired, decreased energy 0 0 0  Change in appetite 0 0 0  Feeling bad or failure about yourself  0 0 0  Trouble concentrating 0 0 0  Moving slowly or fidgety/restless 0 0 0  Suicidal thoughts 0 0 0  PHQ-9 Score 0 0 0  Difficult doing work/chores Not difficult at all Not difficult at all Not difficult at all    BP Readings from Last 3 Encounters:  10/04/22 (!) 150/100  09/19/22 (!) 130/100  09/17/22 (!) 150/100    Physical Exam Vitals and nursing note reviewed.  HENT:     Head: Normocephalic.     Right Ear: Tympanic membrane, ear canal and external ear normal.     Left Ear: Tympanic membrane, ear canal and external ear normal.     Nose: Nose normal. No congestion or rhinorrhea.     Right Sinus: No maxillary sinus tenderness or frontal sinus tenderness.     Left Sinus: No maxillary sinus tenderness or frontal sinus tenderness.     Mouth/Throat:     Mouth: Mucous membranes are moist.     Pharynx: Oropharynx is clear. Uvula midline. No oropharyngeal exudate or posterior oropharyngeal erythema.     Tonsils: No tonsillar exudate.  Eyes:     Extraocular Movements: Extraocular movements intact.     Pupils: Pupils are equal, round, and reactive to light.  Cardiovascular:     Rate and Rhythm: Normal rate and regular rhythm.     Pulses: Normal pulses.     Heart sounds: Normal heart sounds, S1 normal and S2 normal. No murmur heard.    No systolic murmur is present.     No diastolic murmur is present.     No friction rub. No gallop. No S3 or S4 sounds.  Pulmonary:     Effort: No respiratory distress.     Breath sounds: No decreased air movement. No decreased breath sounds, wheezing, rhonchi or rales.  Abdominal:     Tenderness: There is no abdominal tenderness. There is no guarding.     Hernia: No hernia is present.  Musculoskeletal:        General: No swelling or tenderness.     Cervical back: Normal range of motion.   Lymphadenopathy:     Head:     Right side of head: Submandibular adenopathy present.  Left side of head: Submandibular adenopathy present.     Cervical: No cervical adenopathy.     Right cervical: No superficial, deep or posterior cervical adenopathy.    Left cervical: No superficial, deep or posterior cervical adenopathy.     Comments: tender  Neurological:     Mental Status: She is alert.     Wt Readings from Last 3 Encounters:  10/04/22 154 lb (69.9 kg)  09/19/22 157 lb (71.2 kg)  09/17/22 154 lb (69.9 kg)    BP (!) 150/100 (BP Location: Right Arm, Cuff Size: Normal)   Pulse 68   Ht 5\' 6"  (1.676 m)   Wt 154 lb (69.9 kg)   SpO2 100%   BMI 24.86 kg/m   Assessment and Plan:  1. Acute maxillary sinusitis, recurrence not specified Acute.  Persistent.  Stable.  Symptoms and physical exam is consistent with an upper respiratory infection likely sinus in nature.  We will treat with doxycycline 100 mg twice a day for 10 days. - doxycycline (VIBRA-TABS) 100 MG tablet; Take 1 tablet (100 mg total) by mouth 2 (two) times daily.  Dispense: 20 tablet; Refill: 0  2. Acute cough Patient has been encouraged to continue Mucinex DM during the awake hours while at night and when in the presence of individuals on a cruise this upcoming patient will take Promethazine DM as needed for cough. - promethazine-dextromethorphan (PROMETHAZINE-DM) 6.25-15 MG/5ML syrup; Take 5 mLs by mouth 4 (four) times daily as needed.  Dispense: 118 mL; Refill: 0  3. Primary hypertension Chronic.  Usually controlled but patient has not taken her medication in over 24 hours.  Patient usually has controlled blood pressure and today's pressure is likely represents a delay in taking of medication which patient has promised that she will do this soon as she gets home.   Elizabeth Sauer, MD

## 2022-10-04 NOTE — Telephone Encounter (Signed)
      Chief Complaint: Productive cough with wheezing Symptoms: Mild SOB Frequency: 2 weeks ago Pertinent Negatives: Patient denies fever Disposition: [] ED /[] Urgent Care (no appt availability in office) / [x] Appointment(In office/virtual)/ []  [place] [facility]/ [] Home Care/ [] Refused Recommended Disposition /[] [place]/ []  Follow-up with PCP Additional Notes: Has tried Mucinex, not helping.  Reason for Disposition  [1] Continuous (nonstop) coughing interferes with work or school AND [2] no improvement using cough treatment per Care Advice  Answer Assessment - Initial Assessment Questions 1. ONSET: "When did the cough begin?"      2 weeks ago 2. SEVERITY: "How bad is the cough today?"      Severe 3. SPUTUM: "Describe the color of your sputum" (none, dry cough; clear, white, yellow, green)     Clear 4. HEMOPTYSIS: "Are you coughing up any blood?" If so ask: "How much?" (flecks, streaks, tablespoons, etc.)     No 5. DIFFICULTY BREATHING: "Are you having difficulty breathing?" If Yes, ask: "How bad is it?" (e.g., mild, moderate, severe)    - MILD: No SOB at rest, mild SOB with walking, speaks normally in sentences, can lie down, no retractions, pulse < 100.    - MODERATE: SOB at rest, SOB with minimal exertion and prefers to sit, cannot lie down flat, speaks in phrases, mild retractions, audible wheezing, pulse 100-120.    - SEVERE: Very SOB at rest, speaks in single words, struggling to breathe, sitting hunched forward, retractions, pulse > 120      Mild 6. FEVER: "Do you have a fever?" If Yes, ask: "What is your temperature, how was it measured, and when did it start?"     No 7. CARDIAC HISTORY: "Do you have any history of heart disease?" (e.g., heart attack, congestive heart failure)      No 8. LUNG HISTORY: "Do you have any history of lung disease?"  (e.g., pulmonary embolus, asthma, emphysema)     No 9. PE RISK FACTORS: "Do you have a history of blood clots?"  (or: recent major surgery, recent prolonged travel, bedridden)     No 10. OTHER SYMPTOMS: "Do you have any other symptoms?" (e.g., runny nose, wheezing, chest pain)       Wheezing 11. PREGNANCY: "Is there any chance you are pregnant?" "When was your last menstrual period?"       No 12. TRAVEL: "Have you traveled out of the country in the last month?" (e.g., travel history, exposures)       No  Protocols used: Cough - Acute Productive-A-AH

## 2022-10-12 ENCOUNTER — Other Ambulatory Visit: Payer: Self-pay | Admitting: Family Medicine

## 2022-10-12 DIAGNOSIS — E7801 Familial hypercholesterolemia: Secondary | ICD-10-CM

## 2022-10-31 ENCOUNTER — Ambulatory Visit: Payer: BC Managed Care – PPO | Admitting: Family Medicine

## 2022-11-05 ENCOUNTER — Encounter: Payer: Self-pay | Admitting: Family Medicine

## 2022-12-19 ENCOUNTER — Other Ambulatory Visit: Payer: Self-pay | Admitting: Family Medicine

## 2022-12-19 DIAGNOSIS — I10 Essential (primary) hypertension: Secondary | ICD-10-CM

## 2022-12-20 NOTE — Telephone Encounter (Signed)
Requested Prescriptions  Pending Prescriptions Disp Refills   amLODipine (NORVASC) 2.5 MG tablet [Pharmacy Med Name: AMLODIPINE BESYLATE 2.5 MG TAB] 90 tablet 0    Sig: TAKE 1 TABLET BY MOUTH EVERY DAY     Cardiovascular: Calcium Channel Blockers 2 Failed - 12/19/2022  3:05 PM      Failed - Last BP in normal range    BP Readings from Last 1 Encounters:  10/04/22 (!) 150/100         Passed - Last Heart Rate in normal range    Pulse Readings from Last 1 Encounters:  10/04/22 68         Passed - Valid encounter within last 6 months    Recent Outpatient Visits           2 months ago Acute maxillary sinusitis, recurrence not specified   [place] [facility]   3 months ago Primary hypertension   [place] [facility]   3 months ago Primary hypertension   [place] [facility]   1 year ago Asymptomatic microscopic hematuria    [facility]   1 year ago Annual physical exam   [facility]

## 2023-01-16 ENCOUNTER — Other Ambulatory Visit: Payer: Self-pay | Admitting: Family Medicine

## 2023-01-16 DIAGNOSIS — E7801 Familial hypercholesterolemia: Secondary | ICD-10-CM

## 2023-01-16 NOTE — Telephone Encounter (Signed)
Requested Prescriptions  Pending Prescriptions Disp Refills   atorvastatin (LIPITOR) 10 MG tablet [Pharmacy Med Name: ATORVASTATIN 10 MG TABLET] 90 tablet 2    Sig: TAKE 1 TABLET BY MOUTH EVERY DAY     Cardiovascular:  Antilipid - Statins Failed - 01/16/2023  1:39 AM      Failed - Lipid Panel in normal range within the last 12 months    Cholesterol, Total  Date Value Ref Range Status  09/19/2022 213 (H) 100 - 199 mg/dL Final   LDL Chol Calc (NIH)  Date Value Ref Range Status  09/19/2022 140 (H) 0 - 99 mg/dL Final   HDL  Date Value Ref Range Status  09/19/2022 60 >39 mg/dL Final   Triglycerides  Date Value Ref Range Status  09/19/2022 72 0 - 149 mg/dL Final         Passed - Patient is not pregnant      Passed - Valid encounter within last 12 months    Recent Outpatient Visits           3 months ago Acute maxillary sinusitis, recurrence not specified   [place] [facility]   3 months ago Primary hypertension   [place] [facility]   4 months ago Primary hypertension   [place] [facility]   1 year ago Asymptomatic microscopic hematuria   [place] [facility]   1 year ago Annual physical exam   [facility]

## 2023-03-24 ENCOUNTER — Ambulatory Visit
Admission: EM | Admit: 2023-03-24 | Discharge: 2023-03-24 | Disposition: A | Discharge status: Home / Self Care | Payer: BC Managed Care – PPO | Attending: Physician Assistant | Admitting: Physician Assistant

## 2023-03-24 ENCOUNTER — Encounter: Payer: Self-pay | Admitting: Emergency Medicine

## 2023-03-24 DIAGNOSIS — M5416 Radiculopathy, lumbar region: Secondary | ICD-10-CM

## 2023-03-24 MED ORDER — NAPROXEN 375 MG PO TABS: 375.0000 mg | ORAL_TABLET | Freq: Two times a day (BID) | ORAL | 0 refills | Status: DC | PRN

## 2023-03-24 NOTE — Discharge Instructions (Addendum)
 BACK PAIN: Stressed avoiding painful activities . RICE (REST, ICE, COMPRESSION, ELEVATION) guidelines reviewed. May alternate ice and heat. Consider use of muscle rubs, Salonpas patches, etc. Use medications as directed including muscle relaxers if prescribed. Take anti-inflammatory medications as prescribed or OTC NSAIDs/Tylenol.  F/u with PCP in 7-10 days for reexamination, and please feel free to call or return to the urgent care at any time for any questions or concerns you may have and we will be happy to help you!   BACK PAIN RED FLAGS: If the back pain acutely worsens or there are any red flag symptoms such as numbness/tingling, leg weakness, saddle anesthesia, or loss of bowel/bladder control, go immediately to the ER. Follow up with [place] as scheduled or sooner if the pain does not begin to resolve or if it worsens before the follow up    You have a condition requiring you to follow up with Orthopedics so please call one of the following office for appointment:   [facility] Address: [address], [place], [place] [address] Phone: [phone]  [facility] [address] [place], [place] [address] Phone: [phone]  [facility] [address], [place], [place] [address] Phone: [phone]

## 2023-03-24 NOTE — ED Provider Notes (Signed)
[facility]    CSN: [id number] Arrival date & time: 03/24/23  0802      History   Chief Complaint Chief Complaint  Patient presents with   Leg Pain   Knee Pain    HPI Jamie Cervantes is a 62 y.o. female presenting for approximately 1 week history of pain of the left lower back, left hip, left thigh with associated numbness of the left anterior thigh.  She denies any specific injury.  Nothing really makes the discomfort worse.  Denies weakness or feeling as if she is going to fall.  Denies any history of back or hip problems.  Has not taken anything to relieve her discomfort.  States she walks with steel toe shoes on hard floors and does not know if it is contributing to her discomfort.  No loss of bowel or bladder control.  No other complaints.  HPI  Past Medical History:  Diagnosis Date   Complication of anesthesia    Hypertension    Wears dentures     Patient Active Problem List   Diagnosis Date Noted   Colon cancer screening    Polyp of colon    H/O total hysterectomy 08/07/2020    Past Surgical History:  Procedure Laterality Date   ABDOMINAL HYSTERECTOMY     partial   BREAST CYST ASPIRATION Right    neg   COLONOSCOPY WITH PROPOFOL N/A 10/30/2020   Procedure: COLONOSCOPY WITH PROPOFOL;  Surgeon: Midge Minium, MD;  Location: [facility];  Service: Endoscopy;  Laterality: N/A;  1 ML ELEVIEW INJECTED IN APPENDIX/ CECUM AREA @ 1021   POLYPECTOMY N/A 10/30/2020   Procedure: POLYPECTOMY;  Surgeon: Midge Minium, MD;  Location: [facility];  Service: Endoscopy;  Laterality: N/A;   toe spurs      OB History   No obstetric history on file.      Home Medications    Prior to Admission medications   Medication Sig Start Date End Date Taking? Authorizing Provider  naproxen (NAPROSYN) 375 MG tablet Take 1 tablet (375 mg total) by mouth 2 (two) times daily as needed for moderate pain (pain score 4-6). 03/24/23  Yes Eusebio Friendly B, PA-C   amLODipine (NORVASC) 2.5 MG tablet TAKE 1 TABLET BY MOUTH EVERY DAY 12/20/22   Duanne Limerick, MD  atorvastatin (LIPITOR) 10 MG tablet TAKE 1 TABLET BY MOUTH EVERY DAY 01/16/23   Duanne Limerick, MD  doxycycline (VIBRA-TABS) 100 MG tablet Take 1 tablet (100 mg total) by mouth 2 (two) times daily. 10/04/22   Duanne Limerick, MD  lisinopril (ZESTRIL) 10 MG tablet Take 1 tablet (10 mg total) by mouth daily. cardiology 09/17/22   Duanne Limerick, MD  promethazine-dextromethorphan (PROMETHAZINE-DM) 6.25-15 MG/5ML syrup Take 5 mLs by mouth 4 (four) times daily as needed. 10/04/22   Duanne Limerick, MD    Family History Family History  Problem Relation Age of Onset   Heart disease Mother    Hypertension Mother    Breast cancer Neg Hx     Social History Social History   Tobacco Use   Smoking status: Every Day    Types: Cigarettes   Smokeless tobacco: Never   Tobacco comments:    discussed smoking cessation- pt refuses/ has own plan  Vaping Use   Vaping status: Never Used  Substance Use Topics   Alcohol use: Yes    Comment: occasionally   Drug use: Never     Allergies   Penicillins  Review of Systems Review of Systems  Musculoskeletal:  Positive for arthralgias and back pain. Negative for gait problem and joint swelling.  Skin:  Negative for color change and wound.  Neurological:  Positive for numbness. Negative for weakness.     Physical Exam Triage Vital Signs ED Triage Vitals  Encounter Vitals Group     BP      Systolic BP Percentile      Diastolic BP Percentile      Pulse      Resp      Temp      Temp src      SpO2      Weight      Height      Head Circumference      Peak Flow      Pain Score      Pain Loc      Pain Education      Exclude from Growth Chart    No data found.  Updated Vital Signs BP (!) 143/84 (BP Location: Right Arm)   Pulse (!) 59   Temp 97.9 F (36.6 C) (Oral)   Resp 16   SpO2 95%       Physical Exam Vitals and nursing note  reviewed.  Constitutional:      General: She is not in acute distress.    Appearance: Normal appearance. She is not ill-appearing or toxic-appearing.  HENT:     Head: Normocephalic and atraumatic.  Eyes:     General: No scleral icterus.       Right eye: No discharge.        Left eye: No discharge.     Conjunctiva/sclera: Conjunctivae normal.  Cardiovascular:     Rate and Rhythm: Regular rhythm. Bradycardia present.     Heart sounds: Normal heart sounds.  Pulmonary:     Effort: Pulmonary effort is normal. No respiratory distress.     Breath sounds: Normal breath sounds.  Musculoskeletal:     Cervical back: Neck supple.     Lumbar back: Tenderness (left paralumbar muscles) present. No bony tenderness (L5-S1). Normal range of motion (Pain with lateral bending). Negative right straight leg raise test and negative left straight leg raise test.     Comments: 5/5 strength bilateral legs  Skin:    General: Skin is dry.  Neurological:     General: No focal deficit present.     Mental Status: She is alert. Mental status is at baseline.     Motor: No weakness.     Gait: Gait normal.  Psychiatric:        Mood and Affect: Mood normal.        Behavior: Behavior normal.      UC Treatments / Results  Labs (all labs ordered are listed, but only abnormal results are displayed) Labs Reviewed - No data to display  EKG   Radiology No results found.  Procedures Procedures (including critical care time)  Medications Ordered in UC Medications - No data to display  Initial Impression / Assessment and Plan / UC Course  I have reviewed the triage vital signs and the nursing notes.  Pertinent labs & imaging results that were available during my care of the patient were reviewed by me and considered in my medical decision making (see chart for details).   62 year old female presents for approximately 1 week history of atraumatic left lower back pain with radiation to left hip and left  thigh.  Has some numbness of her  left anterior thigh.  Has not taken any OTC meds and denies any red flag signs or symptoms.  Clinical presentation is consistent with lumbar radiculopathy.  Offered ketorolac injection but she declines.  Patient has not taken anything for her symptoms so we will try her on NSAIDs first.  Has normal renal function.  Sent 375 mg naproxen to take twice daily as needed for discomfort.  Encouraged following up with her primary care provider especially if she is not improving over the next couple of weeks.  Reviewed following up sooner going to emergency department if the numbness increases or she were to experience any weakness, loss of bladder or bowel control, etc.   Final Clinical Impressions(s) / UC Diagnoses   Final diagnoses:  Lumbar radiculopathy     Discharge Instructions      BACK PAIN: Stressed avoiding painful activities . RICE (REST, ICE, COMPRESSION, ELEVATION) guidelines reviewed. May alternate ice and heat. Consider use of muscle rubs, Salonpas patches, etc. Use medications as directed including muscle relaxers if prescribed. Take anti-inflammatory medications as prescribed or OTC NSAIDs/Tylenol.  F/u with PCP in 7-10 days for reexamination, and please feel free to call or return to the urgent care at any time for any questions or concerns you may have and we will be happy to help you!   BACK PAIN RED FLAGS: If the back pain acutely worsens or there are any red flag symptoms such as numbness/tingling, leg weakness, saddle anesthesia, or loss of bowel/bladder control, go immediately to the ER. Follow up with [place] as scheduled or sooner if the pain does not begin to resolve or if it worsens before the follow up    You have a condition requiring you to follow up with Orthopedics so please call one of the following office for appointment:   [facility] Address: [address] [place], [place] [address] Phone: [phone]  [facility] [address],  [place], [place] [address] Phone: [phone]  [facility] [address], [place], [place] [address] Phone: [phone]      ED Prescriptions     Medication Sig Dispense Auth. Provider   naproxen (NAPROSYN) 375 MG tablet Take 1 tablet (375 mg total) by mouth 2 (two) times daily as needed for moderate pain (pain score 4-6). 30 tablet Shirlee Latch, PA-C      I have reviewed the PDMP during this encounter.   Shirlee Latch, PA-C 03/24/23 [phone]

## 2023-03-24 NOTE — ED Triage Notes (Addendum)
Pt presents with left leg pain/numbness from her thigh to her knee for over 1 week. Pt has not taken anything for her symptoms.

## 2023-03-27 ENCOUNTER — Other Ambulatory Visit: Payer: Self-pay | Admitting: Family Medicine

## 2023-03-27 DIAGNOSIS — I10 Essential (primary) hypertension: Secondary | ICD-10-CM

## 2023-03-28 NOTE — Telephone Encounter (Signed)
Requested Prescriptions  Pending Prescriptions Disp Refills   amLODipine (NORVASC) 2.5 MG tablet [Pharmacy Med Name: AMLODIPINE BESYLATE 2.5 MG TAB] 90 tablet 0    Sig: TAKE 1 TABLET BY MOUTH EVERY DAY     Cardiovascular: Calcium Channel Blockers 2 Failed - 03/27/2023 12:51 AM      Failed - Last BP in normal range    BP Readings from Last 1 Encounters:  03/24/23 (!) 143/84         Failed - Valid encounter within last 6 months    Recent Outpatient Visits           5 months ago Acute maxillary sinusitis, recurrence not specified   [place] [facility]   6 months ago Primary hypertension   [place] [facility]   6 months ago Primary hypertension   [place] [facility]   1 year ago Asymptomatic microscopic hematuria   [place] [facility]   1 year ago Annual physical exam   [facility]              Passed - Last Heart Rate in normal range    Pulse Readings from Last 1 Encounters:  03/24/23 (!) 59

## 2023-04-08 ENCOUNTER — Encounter: Payer: Self-pay | Admitting: Family Medicine

## 2023-04-08 ENCOUNTER — Ambulatory Visit: Payer: BC Managed Care – PPO | Admitting: Family Medicine

## 2023-04-08 VITALS — BP 122/76 | HR 55 | Temp 97.8°F | Ht 66.0 in | Wt 158.0 lb

## 2023-04-08 DIAGNOSIS — I10 Essential (primary) hypertension: Secondary | ICD-10-CM | POA: Diagnosis not present

## 2023-04-08 DIAGNOSIS — B029 Zoster without complications: Secondary | ICD-10-CM | POA: Diagnosis not present

## 2023-04-08 DIAGNOSIS — E7800 Pure hypercholesterolemia, unspecified: Secondary | ICD-10-CM | POA: Insufficient documentation

## 2023-04-08 DIAGNOSIS — E7801 Familial hypercholesterolemia: Secondary | ICD-10-CM | POA: Diagnosis not present

## 2023-04-08 DIAGNOSIS — Z23 Encounter for immunization: Secondary | ICD-10-CM

## 2023-04-08 MED ORDER — VALACYCLOVIR HCL 1 G PO TABS
1000.0000 mg | ORAL_TABLET | Freq: Two times a day (BID) | ORAL | 0 refills | Status: AC
Start: 2023-04-08 — End: 2023-04-18

## 2023-04-08 MED ORDER — LISINOPRIL 10 MG PO TABS
10.0000 mg | ORAL_TABLET | Freq: Every day | ORAL | 0 refills | Status: DC
Start: 2023-04-08 — End: 2023-04-08

## 2023-04-08 MED ORDER — AMLODIPINE BESYLATE 2.5 MG PO TABS
2.5000 mg | ORAL_TABLET | Freq: Every day | ORAL | 1 refills | Status: AC
Start: 2023-04-08 — End: ?

## 2023-04-08 MED ORDER — ATORVASTATIN CALCIUM 10 MG PO TABS: 10.0000 mg | ORAL_TABLET | Freq: Every day | ORAL | 2 refills | Status: DC

## 2023-04-08 MED ORDER — AMLODIPINE BESYLATE 2.5 MG PO TABS
2.5000 mg | ORAL_TABLET | Freq: Every day | ORAL | 0 refills | Status: DC
Start: 2023-04-08 — End: 2023-04-08

## 2023-04-08 MED ORDER — PREDNISONE 10 MG PO TABS: 10.0000 mg | ORAL_TABLET | Freq: Every day | ORAL | 0 refills | Status: DC

## 2023-04-08 MED ORDER — ATORVASTATIN CALCIUM 10 MG PO TABS
10.0000 mg | ORAL_TABLET | Freq: Every day | ORAL | 2 refills | Status: DC
Start: 2023-04-08 — End: 2023-04-08

## 2023-04-08 MED ORDER — LISINOPRIL 10 MG PO TABS: 10.0000 mg | ORAL_TABLET | Freq: Every day | ORAL | 1 refills | Status: AC

## 2023-04-08 NOTE — Patient Instructions (Signed)
Shingles  Shingles, or herpes zoster, is an infection. It gives you a skin rash and blisters. These infected areas may hurt a lot. Shingles only happens if: You've had chickenpox. You've been given a shot called a vaccine to protect you from getting chickenpox. Shingles is rare in this case. What are the causes? Shingles is caused by a germ called the varicella-zoster virus. This is the same germ that causes chickenpox. After you're exposed to the germ, it stays in your body but is dormant. This means it isn't active. Shingles happens if the germ becomes active again. This can happen years after you're first exposed to the germ. What increases the risk? You may be more likely to get shingles if: You're older than 64 years of age. You're under a lot of stress. You have a weak immune system. The immune system is your body's defense system. It may be weak if: You have human immunodeficiency virus (HIV). You have acquired immunodeficiency syndrome (AIDS). You have cancer. You take medicines that weaken your immune system. These include organ transplant medicines. What are the signs or symptoms? The first symptoms of shingles may be itching, tingling, or pain. Your skin may feel like it's burning. A few days or weeks later, you'll get a rash. Here's what you can expect: The rash is likely to be on one side of your body. The rash may be shaped like a belt or a band. Over time, it will turn into blisters filled with fluid. The blisters will break open and change into scabs. The scabs will dry up in about 2-3 weeks. You may also have: A fever. Chills. A headache. Nausea. How is this diagnosed? Shingles is diagnosed with a skin exam. A sample called a culture may be taken from one of your blisters and sent to a lab. This will show if you have shingles. How is this treated? The rash may last for several weeks. There's no cure for shingles, but your health care provider may give you medicines.  These medicines may: Help with pain. Help with itching. Help with irritation and swelling. Help you get better sooner. Help to prevent long-term problems. If the rash is on your face, you may need to see an eye doctor or an ear, nose, and throat (ENT) doctor. Follow these instructions at home: Medicines Take your medicines only as told by your provider. Put an anti-itch cream or numbing cream on the rash or blisters as told by your provider. Relieving itching and discomfort  To help with itching: Put cold, wet cloths called cold compresses on the rash or blisters. Take a cool bath. Try adding baking soda or dry oatmeal to the water. Do not bathe in hot water. Use calamine lotion on the rash or blisters. You can get this type of lotion at the store. Blister and rash care Keep your rash covered with a loose bandage. Wear loose clothes that don't rub on your rash. Take care of your rash as told by your provider. Make sure you: Wash your hands with soap and water for at least 20 seconds before and after you change your bandage. If you can't use soap and water, use hand sanitizer. Keep your rash and blisters clean by washing them with mild soap and cool water. Change your bandage. Check your rash every day for signs of infection. Check for: More redness, swelling, or pain. Fluid or blood. Warmth. Pus or a bad smell. Do not scratch your rash. Do not pick at your  blisters. To help you not scratch: Keep your fingernails clean and cut short. Try to wear gloves or mittens when you sleep. General instructions Rest. Wash your hands often with soap and water for at least 20 seconds. If you can't use soap and water, use hand sanitizer. Washing your hands lowers your chance of getting a skin infection. Your infection can cause chickenpox in others. If you have blisters that aren't scabs yet, stay away from: Babies. Pregnant people. Children who have eczema. Older people who have organ  transplants. People who have a long-term, or chronic, illness. Anyone who hasn't had chickenpox before. Anyone who hasn't gotten the chickenpox vaccine. How is this prevented? Vaccines are the best way to prevent you from getting chickenpox or shingles. Talk with your provider about getting these shots. Where to find more information Centers for Disease Control and Prevention (CDC): TonerProm[employer].no Contact a health care provider if: Your pain doesn't get better with medicine. Your pain doesn't get better after the rash heals. You have any signs of infection around the rash. Your rash or blisters get worse. You have a fever or chills. Get help right away if: The rash is on your face or nose. You have pain in your face or by your eye. You lose feeling on one side of your face. You have trouble seeing. You have ear pain or ringing in your ear. This information is not intended to replace advice given to you by your health care provider. Make sure you discuss any questions you have with your health care provider. Document Revised: 05/24/2022 Document Reviewed: 05/24/2022 Elsevier Patient Education  2024 [employer]. GUIDELINES FOR  LOW-CHOLESTEROL, LOW-TRIGLYCERIDE DIETS    FOODS TO USE   MEATS, FISH Choose lean meats (chicken, [place], veal, and non-fatty cuts of beef with excess fat trimmed; one serving = 3 oz of cooked meat). Also, fresh or frozen fish, canned fish packed in water, and shellfish (lobster, crabs, shrimp, and oysters). Limit use to no more than one serving of one of these per week. Shellfish are high in cholesterol but low in saturated fat and should be used sparingly. Meats and fish should be broiled (pan or oven) or baked on a rack.  EGGS Egg substitutes and egg whites (use freely). Egg yolks (limit two per week).  FRUITS Eat three servings of fresh fruit per day (1 serving =  cup). Be sure to have at least one citrus fruit daily. Frozen and canned fruit with no sugar or syrup  added may be used.  VEGETABLES Most vegetables are not limited (see next page). One dark-green (string beans, escarole) or one deep yellow (squash) vegetable is recommended daily. Cauliflower, broccoli, and celery, as well as potato skins, are recommended for their fiber content. (Fiber is associated with cholesterol reduction) It is preferable to steam vegetables, but they may be boiled, strained, or braised with polyunsaturated vegetable oil (see below).  BEANS Dried peas or beans (1 serving =  cup) may be used as a bread substitute.  NUTS Almonds, walnuts, and peanuts may be used sparingly  (1 serving = 1 Tablespoonful). Use pumpkin, sesame, or sunflower seeds.  BREADS, GRAINS One roll or one slice of whole grain or enriched bread may be used, or three soda crackers or four pieces of melba toast as a substitute. Spaghetti, rice or noodles ( cup) or  large ear of corn may be used as a bread substitute. In preparing these foods do not use butter or shortening, use soft margarine.  Also use egg and sugar substitutes.  Choose high fiber grains, such as oats and whole wheat.  CEREALS Use  cup of hot cereal or  cup of cold cereal per day. Add a sugar substitute if desired, with 99% fat free or skim milk.  MILK PRODUCTS Always use 99% fat free or skim milk, dairy products such as low fat cheeses (farmer's uncreamed diet cottage), low-fat yogurt, and powdered skim milk.  FATS, OILS Use soft (not stick) margarine; vegetable oils that are high in polyunsaturated fats (such as safflower, sunflower, soybean, corn, and cottonseed). Always refrigerate meat drippings to harden the fat and remove it before preparing gravies  DESSERTS, SNACKS Limit to two servings per day; substitute each serving for a bread/cereal serving: ice milk, water sherbet (1/4 cup); unflavored gelatin or gelatin flavored with sugar substitute (1/3 cup); pudding prepared with skim milk (1/2 cup); egg white souffls; unbuttered popcorn (1   cups). Substitute carob for chocolate.  BEVERAGES Fresh fruit juices (limit 4 oz per day); black coffee, plain or herbal teas; soft drinks with sugar substitutes; club soda, preferably salt-free; cocoa made with skim milk or nonfat dried milk and water (sugar substitute added if desired); clear broth. Alcohol: limit two servings per day (see second page).  MISCELLANEOUS  You may use the following freely: vinegar, spices, herbs, nonfat bouillon, mustard, Worcestershire sauce, soy sauce, flavoring essence.                  GUIDELINES FOR  LOW-CHOLESTEROL, LOW TRIGLYCERIDE DIETS    FOODS TO AVOID   MEATS, FISH Marbled beef, pork, bacon, sausage, and other pork products; fatty fowl (duck, goose); skin and fat of [place] and chicken; processed meats; luncheon meats (salami, bologna); frankfurters and fast-food hamburgers (theyre loaded with fat); organ meats (kidneys, liver); canned fish packed in oil.  EGGS Limit egg yolks to two per week.   FRUITS Coconuts (rich in saturated fats).  VEGETABLES Avoid avocados. Starchy vegetables (potatoes, corn, lima beans, dried peas, beans) may be used only if substitutes for a serving of bread or cereal. (Baked potato skin, however, is desirable for its fiber content.  BEANS Commercial baked beans with sugar and/or pork added.  NUTS Avoid nuts.  Limit peanuts and walnuts to one tablespoonful per day.  BREADS, GRAINS Any baked goods with shortening and/or sugar. Commercial mixes with dried eggs and whole milk. Avoid sweet rolls, doughnuts, breakfast pastries (Danish), and sweetened packaged cereals (the added sugar converts readily to triglycerides).  MILK PRODUCTS Whole milk and whole-milk packaged goods; cream; ice cream; whole-milk puddings, yogurt, or cheeses; nondairy cream substitutes.  FATS, OILS Butter, lard, animal fats, bacon drippings, gravies, cream sauces as well as palm and coconut oils. All these are high in saturated fats. Examine labels  on cholesterol free products for hydrogenated fats. (These are oils that have been hardened into solids and in the process have become saturated.)  DESSERTS, SNACKS Fried snack foods like potato chips; chocolate; candies in general; jams, jellies, syrups; whole- milk puddings; ice cream and milk sherbets; hydrogenated peanut butter.  BEVERAGES Sugared fruit juices and soft drinks; cocoa made with whole milk and/or sugar. When using alcohol (1 oz liquor, 5 oz beer, or 2  oz dry table wine per serving), one serving must be substituted for one bread or cereal serving (limit, two servings of alcohol per day).   SPECIAL NOTES    Remember that even non-limited foods should be used in moderation. While on a cholesterol-lowering diet, be  sure to avoid animal fats and marbled meats. 3. While on a triglyceride-lowering diet, be sure to avoid sweets and to control the amount of carbohydrates you eat (starchy foods such as flour, bread, potatoes).While on a tri-glyceride-lowering diet, be sure to avoid sweets Buy a good low-fat cookbook, such as the one published by the American Heart Association. Consult your physician if you have any questions.               [facility] Low Glycemic Diet Plan   Low Glycemic Foods (20-49) Moderate Glycemic Foods (50-69) High Glycemic Foods (70-100)      Breakfast Creals Breakfast Cereals Breakfast Cereals  All Bran All-Bran Fruit'n Oats   Bran Buds Bran Chex   Cheerios Corn chex    Fiber One Oatmeal (not instant)   Just Right Mini-Wheats   Corn Flakes Cream of Wheat    Oat Bran Special K Swiss Muesli   Grape Nuts Grape Nut Flakes      Grits Nutri-Grain    Fruits and fruit juice: Fruits Puffed Rice Puffed Wheat    (Limit to 1-2 Servings per day) Banana (under-ride) Dates   Rice Chex Rice Krispies    Apples Apricots (fresh/dried)   Figs Grapes   Shredded Wheat Team    Blackberries Blueberries   Kiwi Mango   Total     Cherries  Cranberries   Oranges Raisins     Peaches Pears    Fruits  Plums Prunes   Fruit Juices Pineapple Watermelon    Grapefruit Raspberries   Cranberry Juice Orange Juice   Banana (over-ripe)     Strawberries Tangerines      Apple Juice Grapefruit Juice   Beans and Legumes Beverages  Tomato Juice    [place]-type baked beans Sodas, sweet tea, pineapple juice   Canned pinto, kidney, or navy beans   Beans and Legumes (fresh-cooked) Green peas Vegetables  Black-eyed peas Butter Beans    Potato, baked, boiled, fried, mashed  Chick peas Lentils   Vegetables French fries  Green beans Lima beans   Beets Carrots   Canned or frozen corn  Kidney beans Navy beans   Sweet potato Yam   Parsnips  Pinto beans Snow peas   Corn on the cob Winter squash      Non-starchy vegetables Grains Breads  Asparagus, avocado, broccoli, cabbage Cornmeal Rice, brown   Most breads (white and whole grain)  cauliflower, celery, cucumber, greens Rice, white Couscous   Bagels Bread sticks    lettuce, mushrooms, peppers, tomatoes  Bread stuffing Kaiser roll    okra, onions, spinach, summer squash Pasta Dinner rolls   [employer], cheese     Grains Ravioli, meat filled Spaghetti, white   Grains  Barley Bulgur    Rice, instant Tapioca, with milk    Rye Wild rice   Nuts    Cashews Macadamia   Candy and most cookies  Nuts and oils    Almonds, peanuts, sunflower seeds Snacks Snacks  hazelnuts, pecans, walnuts Chocolate Ice cream, lowfat   Donuts Corn chips    Oils that are liquid at room temperature Muffin Popcorn   Jelly beans Pretzels      Pastries  Dairy, fish, meat, soy, and eggs    Milk, skim Lowfat cheese    Restaurant and ethnic foods  Yogurt, lowfat, fruit sugar sweetened  Most [place] food (sugar in stir fry    or wok sauce)  Lean red meat Fish    Teriyaki-style meats  and vegetables  Skinless chicken and [place], shellfish        Egg whites (up to 3 daily), Soy Products    Egg yolks  (up to 7 or _____ per week)

## 2023-04-08 NOTE — Progress Notes (Signed)
Date:  04/08/2023   Name:  Jamie Cervantes   DOB:  1960-12-20   MRN:  [id number]   Chief Complaint: Hypertension, Hyperlipidemia, and Rash (Started a week ago. Was painful but now better. Slight itching. Left inner thigh.)  Hypertension This is a chronic problem. The current episode started more than 1 year ago. The problem has been gradually improving since onset. The problem is controlled. Pertinent negatives include no anxiety, blurred vision, chest pain, headaches, malaise/fatigue, neck pain, orthopnea, palpitations, peripheral edema, PND, shortness of breath or sweats. There are no associated agents to hypertension. Risk factors for coronary artery disease include dyslipidemia. Past treatments include ACE inhibitors and calcium channel blockers. The current treatment provides moderate improvement. There are no compliance problems.  There is no history of angina, CAD/MI, CVA or PVD. There is no history of chronic renal disease, a hypertension causing med or renovascular disease.  Hyperlipidemia The current episode started more than 1 year ago. She has no history of chronic renal disease or hypothyroidism. Pertinent negatives include no chest pain, focal sensory loss, focal weakness, myalgias or shortness of breath. There are no compliance problems.  Risk factors for coronary artery disease include dyslipidemia and hypertension.  Rash This is a new problem. The current episode started in the past 7 days. The affected locations include the left lower leg. The rash is characterized by redness and draining. Associated with: zoxter. Associated symptoms include rhinorrhea. Pertinent negatives include no congestion, cough, fatigue, fever or shortness of breath. Past treatments include analgesics (advil).    Lab Results  Component Value Date   NA 143 09/19/2022   K 4.9 09/19/2022   CO2 22 09/19/2022   GLUCOSE 90 09/19/2022   BUN 11 09/19/2022   CREATININE 0.99 09/19/2022   CALCIUM 10.0  09/19/2022   EGFR 64 09/19/2022   GFRNONAA 56 (L) 08/17/2019   Lab Results  Component Value Date   CHOL 213 (H) 09/19/2022   HDL 60 09/19/2022   LDLCALC 140 (H) 09/19/2022   TRIG 72 09/19/2022   No results found for: "TSH" No results found for: "HGBA1C" Lab Results  Component Value Date   WBC 10.0 08/20/2021   HGB 13.6 08/20/2021   HCT 42.1 08/20/2021   MCV 86 08/20/2021   PLT 262 08/20/2021   Lab Results  Component Value Date   ALT 23 09/19/2022   AST 26 09/19/2022   ALKPHOS 141 (H) 09/19/2022   BILITOT 0.3 09/19/2022   No results found for: "25OHVITD2", "25OHVITD3", "VD25OH"   Review of Systems  Constitutional:  Negative for activity change, appetite change, chills, diaphoresis, fatigue, fever, malaise/fatigue and unexpected weight change.  HENT:  Positive for rhinorrhea. Negative for congestion, dental problem, drooling, facial swelling, hearing loss, mouth sores, nosebleeds, postnasal drip and sinus pain.   Eyes:  Negative for blurred vision and visual disturbance.  Respiratory:  Negative for cough, choking, chest tightness, shortness of breath and wheezing.   Cardiovascular:  Negative for chest pain, palpitations, orthopnea, leg swelling and PND.  Gastrointestinal:  Negative for abdominal pain and blood in stool.  Endocrine: Negative for polydipsia, polyphagia and polyuria.  Genitourinary:  Negative for hematuria.  Musculoskeletal:  Negative for myalgias and neck pain.  Skin:  Positive for rash.  Neurological:  Negative for focal weakness and headaches.    Patient Active Problem List   Diagnosis Date Noted  . Colon cancer screening   . Polyp of colon   . H/O total hysterectomy 08/07/2020  Allergies  Allergen Reactions  . Penicillins Nausea Only    Past Surgical History:  Procedure Laterality Date  . ABDOMINAL HYSTERECTOMY     partial  . BREAST CYST ASPIRATION Right    neg  . COLONOSCOPY WITH PROPOFOL N/A 10/30/2020   Procedure: COLONOSCOPY WITH  PROPOFOL;  Surgeon: Midge Minium, MD;  Location: [facility];  Service: Endoscopy;  Laterality: N/A;  1 ML ELEVIEW INJECTED IN APPENDIX/ CECUM AREA @ 1021  . POLYPECTOMY N/A 10/30/2020   Procedure: POLYPECTOMY;  Surgeon: Midge Minium, MD;  Location: [facility];  Service: Endoscopy;  Laterality: N/A;  . toe spurs      Social History   Tobacco Use  . Smoking status: Every Day    Types: Cigarettes  . Smokeless tobacco: Never  . Tobacco comments:    discussed smoking cessation- pt refuses/ has own plan  Vaping Use  . Vaping status: Never Used  Substance Use Topics  . Alcohol use: Yes    Comment: occasionally  . Drug use: Never     Medication list has been reviewed and updated.  No outpatient medications have been marked as taking for the 04/08/23 encounter (Office Visit) with Duanne Limerick, MD.       04/08/2023    9:49 AM 10/04/2022    3:51 PM 09/19/2022    9:05 AM 09/17/2022    1:20 PM  GAD 7 : Generalized Anxiety Score  Nervous, Anxious, on Edge 0 0 0 0  Control/stop worrying 0 0 0 0  Worry too much - different things 1 0 0 0  Trouble relaxing 0 0 0 0  Restless 0 0 0 0  Easily annoyed or irritable 0 0 0 0  Afraid - awful might happen 0 0 0 0  Total GAD 7 Score 1 0 0 0  Anxiety Difficulty  Not difficult at all Not difficult at all Not difficult at all       04/08/2023    9:49 AM 10/04/2022    3:51 PM 09/19/2022    9:05 AM  Depression screen PHQ 2/9  Decreased Interest 0 0 0  Down, Depressed, Hopeless 0 0 0  PHQ - 2 Score 0 0 0  Altered sleeping 2 0 0  Tired, decreased energy 1 0 0  Change in appetite 0 0 0  Feeling bad or failure about yourself  0 0 0  Trouble concentrating 0 0 0  Moving slowly or fidgety/restless 0 0 0  Suicidal thoughts 0 0 0  PHQ-9 Score 3 0 0  Difficult doing work/chores Not difficult at all Not difficult at all Not difficult at all    BP Readings from Last 3 Encounters:  04/08/23 (!) 144/82  03/24/23 (!) 143/84   10/04/22 (!) 150/100    Physical Exam Vitals and nursing note reviewed. Exam conducted with a chaperone present.  Constitutional:      General: She is not in acute distress.    Appearance: She is not diaphoretic.  HENT:     Head: Normocephalic and atraumatic.     Right Ear: External ear normal.     Left Ear: External ear normal.     Nose: Nose normal.  Eyes:     General:        Right eye: No discharge.        Left eye: No discharge.     Conjunctiva/sclera: Conjunctivae normal.     Pupils: Pupils are equal, round, and reactive to light.  Neck:  Thyroid: No thyromegaly.     Vascular: No JVD.  Cardiovascular:     Rate and Rhythm: Normal rate and regular rhythm.     Heart sounds: Normal heart sounds and S1 normal. No murmur heard.    No systolic murmur is present.     No diastolic murmur is present.     No friction rub. No gallop.  Pulmonary:     Effort: Pulmonary effort is normal.     Breath sounds: Normal breath sounds. No wheezing, rhonchi or rales.  Abdominal:     General: Bowel sounds are normal.     Palpations: Abdomen is soft. There is no mass.     Tenderness: There is no abdominal tenderness. There is no guarding.  Musculoskeletal:        General: Normal range of motion.     Cervical back: Normal range of motion and neck supple.  Lymphadenopathy:     Cervical: No cervical adenopathy.  Skin:    General: Skin is warm and dry.  Neurological:     Mental Status: She is alert.     Deep Tendon Reflexes: Reflexes are normal and symmetric.    Wt Readings from Last 3 Encounters:  04/08/23 158 lb (71.7 kg)  10/04/22 154 lb (69.9 kg)  09/19/22 157 lb (71.2 kg)    BP (!) 144/82   Pulse (!) 55   Temp 97.8 F (36.6 C) (Oral)   Ht 5\' 6"  (1.676 m)   Wt 158 lb (71.7 kg)   SpO2 97%   BMI 25.50 kg/m   Assessment and Plan: 1. Herpes zoster without complication (Primary) New onset.  Persistent.  Gradually improving but it has been about a week and still there and  even though his health status hide the 48-hour I am going to go ahead and treat with Valtrex 1 g twice a day for 10 days and prednisone 10 mg once a day. - valACYclovir (VALTREX) 1000 MG tablet; Take 1 tablet (1,000 mg total) by mouth 2 (two) times daily for 10 days.  Dispense: 20 tablet; Refill: 0 - predniSONE (DELTASONE) 10 MG tablet; Take 1 tablet (10 mg total) by mouth daily with breakfast.  Dispense: 30 tablet; Refill: 0  2. Primary hypertension Chronic.  Controlled.  Stable.  Blood pressure today 122/76.  Asymptomatic.  Tolerating medication well.  Continue lisinopril 10 mg once a day and amlodipine 2.5 mg once a day.  Will recheck in 6 months.  Review of previous CMP is acceptable - lisinopril (ZESTRIL) 10 MG tablet; Take 1 tablet (10 mg total) by mouth daily. cardiology  Dispense: 60 tablet; Refill: 0 - amLODipine (NORVASC) 2.5 MG tablet; Take 1 tablet (2.5 mg total) by mouth daily.  Dispense: 90 tablet; Refill: 0  3. Familial hypercholesterolemia Chronic.  Controlled.  Stable.  Continue atorvastatin 10 mg once a day.  Will recheck in 6 months.  Review of previous LDL is acceptable - atorvastatin (LIPITOR) 10 MG tablet; Take 1 tablet (10 mg total) by mouth daily.  Dispense: 90 tablet; Refill: 2  4. Influenza vaccine needed Discussed and administered. - Flu vaccine trivalent PF, 6mos and older(Flulaval,Afluria,Fluarix,Fluzone)      Elizabeth Sauer, MD

## 2023-04-30 ENCOUNTER — Ambulatory Visit: Payer: Self-pay

## 2023-04-30 NOTE — Telephone Encounter (Signed)
 Chief Complaint: Vaginal discharge Symptoms: vaginal discharge white to clear, vaginal itching, Vaginal odor Frequency: constant Pertinent Negatives: Patient denies fever, rash, urinary symptoms Disposition: [] ED /[] Urgent Care (no appt availability in office) / [x] Appointment(In office/virtual)/ []  [facility]/ [] Home Care/ [] Refused Recommended Disposition /[] [facility]/ []  Follow-up with PCP Additional Notes: Patient states she has had vaginal discharge, itching and vaginal odor. Patient stated the symptoms started around the time she received treatment for a shingles rash on her thigh. Patient reports the itching is the worse symptoms right now. Care advice was given and patient has been scheduled for an appointment tomorrow with PCP for evaluation.  Summary: discharge and itching   Pt called in has smell, itching and discharge,from vagina she thinks , she has this from the antibiotic that was taken. She can't come tomorrow for an appt, so wants to see if something can be sent in without appt     Reason for Disposition  Bad smelling vaginal discharge  Answer Assessment - Initial Assessment Questions 1. DISCHARGE: Describe the discharge. (e.g., white, yellow, green, gray, foamy, cottage cheese-like)     White  2. ODOR: Is there a bad odor?     Yes  3. ONSET: When did the discharge begin?     Ongoing  4. RASH: Is there a rash in the genital area? If Yes, ask: Describe it. (e.g., redness, blisters, sores, bumps)     I had shingles rash on the thigh 5. ABDOMEN PAIN: Are you having any abdomen pain? If Yes, ask: What does it feel like?  (e.g., crampy, dull, intermittent, constant)      No  6. ABDOMEN PAIN SEVERITY: If present, ask: How bad is it? (e.g., Scale 1-10; mild, moderate, or severe)   - MILD (1-3): Doesn't interfere with normal activities, abdomen soft and not tender to touch.    - MODERATE (4-7): Interferes with normal activities or awakens  from sleep, abdomen tender to touch.    - SEVERE (8-10): Excruciating pain, doubled over, unable to do any normal activities. (R/O peritonitis)      None 7. CAUSE: What do you think is causing the discharge? Have you had the same problem before? What happened then?     I think it is a yeast infection  8. OTHER SYMPTOMS: Do you have any other symptoms? (e.g., fever, itching, vaginal bleeding, pain with urination, injury to genital area, vaginal foreign body)     itching  Protocols used: Vaginal Discharge-A-AH

## 2023-05-01 ENCOUNTER — Other Ambulatory Visit (HOSPITAL_COMMUNITY)
Admission: RE | Admit: 2023-05-01 | Discharge: 2023-05-01 | Disposition: A | Discharge status: Home / Self Care | Payer: BC Managed Care – PPO | Source: Ambulatory Visit | Attending: Family Medicine | Admitting: Family Medicine

## 2023-05-01 ENCOUNTER — Encounter: Payer: Self-pay | Admitting: Family Medicine

## 2023-05-01 ENCOUNTER — Ambulatory Visit: Payer: BC Managed Care – PPO | Admitting: Family Medicine

## 2023-05-01 VITALS — BP 110/80 | HR 68 | Ht 66.0 in | Wt 157.8 lb

## 2023-05-01 DIAGNOSIS — A5901 Trichomonal vulvovaginitis: Secondary | ICD-10-CM | POA: Insufficient documentation

## 2023-05-01 DIAGNOSIS — B3731 Acute candidiasis of vulva and vagina: Secondary | ICD-10-CM | POA: Insufficient documentation

## 2023-05-01 DIAGNOSIS — N760 Acute vaginitis: Secondary | ICD-10-CM

## 2023-05-01 MED ORDER — FLUCONAZOLE 150 MG PO TABS: 150.0000 mg | ORAL_TABLET | Freq: Once | ORAL | 0 refills | Status: AC

## 2023-05-01 NOTE — Progress Notes (Signed)
 Date:  05/01/2023   Name:  Jamie Cervantes   DOB:  08-26-1960   MRN:  [id number]   Chief Complaint: Vaginal Discharge (Patient presents today for vaginal discharge x 1.5 weeks, itching, no burning, and odor. Patient can't hold urone. )  Vaginal Discharge The patient's primary symptoms include genital itching, a genital odor and vaginal discharge. The patient's pertinent negatives include no genital lesions, genital rash, missed menses, pelvic pain or vaginal bleeding. The current episode started in the past 7 days. The problem occurs constantly. The problem has been waxing and waning. The pain is mild. Associated symptoms include urgency. Pertinent negatives include no chills, constipation, diarrhea, dysuria, fever, frequency, hematuria, joint pain, joint swelling, painful intercourse or sore throat. The vaginal discharge was yellow. There has been no bleeding.    Lab Results  Component Value Date   NA 143 09/19/2022   K 4.9 09/19/2022   CO2 22 09/19/2022   GLUCOSE 90 09/19/2022   BUN 11 09/19/2022   CREATININE 0.99 09/19/2022   CALCIUM  10.0 09/19/2022   EGFR 64 09/19/2022   GFRNONAA 56 (L) 08/17/2019   Lab Results  Component Value Date   CHOL 213 (H) 09/19/2022   HDL 60 09/19/2022   LDLCALC 140 (H) 09/19/2022   TRIG 72 09/19/2022   No results found for: TSH No results found for: HGBA1C Lab Results  Component Value Date   WBC 10.0 08/20/2021   HGB 13.6 08/20/2021   HCT 42.1 08/20/2021   MCV 86 08/20/2021   PLT 262 08/20/2021   Lab Results  Component Value Date   ALT 23 09/19/2022   AST 26 09/19/2022   ALKPHOS 141 (H) 09/19/2022   BILITOT 0.3 09/19/2022   No results found for: 25OHVITD2, 25OHVITD3, VD25OH   Review of Systems  Constitutional:  Negative for chills, diaphoresis and fever.  HENT:  Negative for sore throat.   Gastrointestinal:  Negative for constipation and diarrhea.  Genitourinary:  Positive for urgency and vaginal discharge. Negative for  dysuria, frequency, hematuria, missed menses and pelvic pain.  Musculoskeletal:  Negative for joint pain.    Patient Active Problem List   Diagnosis Date Noted   Familial hypercholesterolemia 04/08/2023   Colon cancer screening    Polyp of colon    H/O total hysterectomy 08/07/2020    Allergies  Allergen Reactions   Penicillins Nausea Only    Past Surgical History:  Procedure Laterality Date   ABDOMINAL HYSTERECTOMY     partial   BREAST CYST ASPIRATION Right    neg   COLONOSCOPY WITH PROPOFOL  N/A 10/30/2020   Procedure: COLONOSCOPY WITH PROPOFOL ;  Surgeon: Jinny Carmine, MD;  Location: [facility];  Service: Endoscopy;  Laterality: N/A;  1 ML ELEVIEW INJECTED IN APPENDIX/ CECUM AREA @ 1021   POLYPECTOMY N/A 10/30/2020   Procedure: POLYPECTOMY;  Surgeon: Jinny Carmine, MD;  Location: [facility];  Service: Endoscopy;  Laterality: N/A;   toe spurs      Social History   Tobacco Use   Smoking status: Every Day    Types: Cigarettes   Smokeless tobacco: Never   Tobacco comments:    discussed smoking cessation- pt refuses/ has own plan  Vaping Use   Vaping status: Never Used  Substance Use Topics   Alcohol use: Yes    Comment: occasionally   Drug use: Never     Medication list has been reviewed and updated.  Current Meds  Medication Sig   amLODipine  (NORVASC ) 2.5 MG tablet  Take 1 tablet (2.5 mg total) by mouth daily.   atorvastatin  (LIPITOR) 10 MG tablet Take 1 tablet (10 mg total) by mouth daily.   lisinopril  (ZESTRIL ) 10 MG tablet Take 1 tablet (10 mg total) by mouth daily. cardiology       05/01/2023    2:24 PM 04/08/2023    9:49 AM 10/04/2022    3:51 PM 09/19/2022    9:05 AM  GAD 7 : Generalized Anxiety Score  Nervous, Anxious, on Edge 0 0 0 0  Control/stop worrying 1 0 0 0  Worry too much - different things 1 1 0 0  Trouble relaxing 1 0 0 0  Restless 0 0 0 0  Easily annoyed or irritable 0 0 0 0  Afraid - awful might happen 0 0 0 0  Total GAD  7 Score 3 1 0 0  Anxiety Difficulty Not difficult at all  Not difficult at all Not difficult at all       05/01/2023    2:24 PM 04/08/2023    9:49 AM 10/04/2022    3:51 PM  Depression screen PHQ 2/9  Decreased Interest 0 0 0  Down, Depressed, Hopeless 0 0 0  PHQ - 2 Score 0 0 0  Altered sleeping 1 2 0  Tired, decreased energy 1 1 0  Change in appetite 2 0 0  Feeling bad or failure about yourself  0 0 0  Trouble concentrating 0 0 0  Moving slowly or fidgety/restless 0 0 0  Suicidal thoughts 0 0 0  PHQ-9 Score 4 3 0  Difficult doing work/chores Not difficult at all Not difficult at all Not difficult at all    BP Readings from Last 3 Encounters:  05/01/23 110/80  04/08/23 122/76  03/24/23 (!) 143/84    Physical Exam Vitals and nursing note reviewed.  HENT:     Right Ear: Tympanic membrane, ear canal and external ear normal. There is no impacted cerumen.     Left Ear: Tympanic membrane, ear canal and external ear normal. There is no impacted cerumen.     Nose: Nose normal. No congestion or rhinorrhea.     Mouth/Throat:     Mouth: Mucous membranes are moist.     Pharynx: Oropharynx is clear. No oropharyngeal exudate or posterior oropharyngeal erythema.  Eyes:     Pupils: Pupils are equal, round, and reactive to light.  Cardiovascular:     Rate and Rhythm: Normal rate.     Heart sounds: No murmur heard.    No friction rub. No gallop.  Pulmonary:     Breath sounds: No wheezing, rhonchi or rales.  Abdominal:     General: Abdomen is flat.     Tenderness: There is no abdominal tenderness.     Wt Readings from Last 3 Encounters:  05/01/23 157 lb 12.8 oz (71.6 kg)  04/08/23 158 lb (71.7 kg)  10/04/22 154 lb (69.9 kg)    BP 110/80   Pulse 68   Ht 5' 6 (1.676 m)   Wt 157 lb 12.8 oz (71.6 kg)   SpO2 96%   BMI 25.47 kg/m   Assessment and Plan:  1. Acute vaginitis (Primary) Acute.  Persistent.  Gradually improving however but still has some yellow discharge with  some pruritus.  Given that this is the time a year that we have warm dark moist circumstances and that this lends itself to cultivation of candidiasis we will treat with Diflucan  150 mg and obtain a vaginal prep  and pending outcome we will either add or maintain at current status quo therapy. - fluconazole  (DIFLUCAN ) 150 MG tablet; Take 1 tablet (150 mg total) by mouth once for 1 dose.  Dispense: 1 tablet; Refill: 0 - Cervicovaginal ancillary only    Cathryne Molt, MD

## 2023-05-05 ENCOUNTER — Other Ambulatory Visit: Payer: Self-pay | Admitting: Family Medicine

## 2023-05-05 DIAGNOSIS — B029 Zoster without complications: Secondary | ICD-10-CM

## 2023-05-07 LAB — CERVICOVAGINAL ANCILLARY ONLY
Candida Glabrata: NEGATIVE
Candida Vaginitis: POSITIVE — AB
Comment: NEGATIVE
Comment: NEGATIVE
Comment: NEGATIVE
Trichomonas: POSITIVE — AB

## 2023-05-13 ENCOUNTER — Other Ambulatory Visit: Payer: Self-pay

## 2023-05-13 DIAGNOSIS — A599 Trichomoniasis, unspecified: Secondary | ICD-10-CM

## 2023-05-13 MED ORDER — METRONIDAZOLE 500 MG PO TABS: 2000.0000 mg | ORAL_TABLET | Freq: Once | ORAL | 0 refills | Status: DC

## 2023-06-06 ENCOUNTER — Ambulatory Visit: Payer: BC Managed Care – PPO | Admitting: Family Medicine

## 2023-06-06 ENCOUNTER — Ambulatory Visit: Payer: Self-pay

## 2023-06-06 ENCOUNTER — Encounter: Payer: Self-pay | Admitting: Family Medicine

## 2023-06-06 VITALS — BP 130/86 | HR 57 | Ht 66.0 in | Wt 163.0 lb

## 2023-06-06 DIAGNOSIS — B0229 Other postherpetic nervous system involvement: Secondary | ICD-10-CM

## 2023-06-06 MED ORDER — GABAPENTIN 100 MG PO CAPS: 100.0000 mg | ORAL_CAPSULE | Freq: Two times a day (BID) | ORAL | 1 refills | Status: DC

## 2023-06-06 MED ORDER — VALACYCLOVIR HCL 1 G PO TABS: 1000.0000 mg | ORAL_TABLET | Freq: Two times a day (BID) | ORAL | 0 refills | Status: AC

## 2023-06-06 NOTE — Telephone Encounter (Signed)
  Chief Complaint: shingles pain  Symptoms: shingles pain L thigh to knee, intermittent, 7-8/10, itching Frequency: 2 months  Pertinent Negatives: NA Disposition: [] ED /[] Urgent Care (no appt availability in office) / [x] Appointment(In office/virtual)/ []  [facility]/ [] Home Care/ [] Refused Recommended Disposition /[] [place] [facility]/ []  Follow-up with PCP Additional Notes: pt was seen on 04/08/23 for shingles, was given medications for the rash but nothing for nerve pain. She states that she took Aleve last night and didn't really help with the pain. Scheduled OV today at 1120.   Reason for Disposition  SEVERE pain (e.g., excruciating)  Answer Assessment - Initial Assessment Questions 1. APPEARANCE of RASH: "Describe the rash."      Dried rash from shingles  2. LOCATION: "Where is the rash located?"      L leg from thigh to knee  3. ONSET: "When did the rash start?"      2 months  5. PAIN: "Does the rash hurt?" If Yes, ask: "How bad is the pain?"  (Scale 0-10; or none, mild, moderate, severe)    - NONE (0): no pain    - MILD (1-3): doesn't interfere with normal activities     - MODERATE (4-7): interferes with normal activities or awakens from sleep     - SEVERE (8-10): excruciating pain, unable to do any normal activities     7-8/10 intermittent 6. OTHER SYMPTOMS: "Do you have any other symptoms?" (e.g., fever)     Slight itching  Protocols used: Shingles (Zoster)-A-AH

## 2023-06-06 NOTE — Progress Notes (Signed)
Date:  06/06/2023   Name:  Jamie Cervantes   DOB:  18-Oct-1960   MRN:  [id number]   Chief Complaint: Herpes Zoster (Patient having nerve pain in left inner thigh from shingles outbreak. )  Leg Pain  Incident onset: yesterday. There was no injury mechanism. The pain is present in the left thigh. The quality of the pain is described as aching. The pain is at a severity of 7/10. The pain is moderate. The pain has been Fluctuating since onset. Pertinent negatives include no inability to bear weight, loss of motion, loss of sensation, muscle weakness, numbness or tingling. The symptoms are aggravated by palpation.    Lab Results  Component Value Date   NA 143 09/19/2022   K 4.9 09/19/2022   CO2 22 09/19/2022   GLUCOSE 90 09/19/2022   BUN 11 09/19/2022   CREATININE 0.99 09/19/2022   CALCIUM 10.0 09/19/2022   EGFR 64 09/19/2022   GFRNONAA 56 (L) 08/17/2019   Lab Results  Component Value Date   CHOL 213 (H) 09/19/2022   HDL 60 09/19/2022   LDLCALC 140 (H) 09/19/2022   TRIG 72 09/19/2022   No results found for: "TSH" No results found for: "HGBA1C" Lab Results  Component Value Date   WBC 10.0 08/20/2021   HGB 13.6 08/20/2021   HCT 42.1 08/20/2021   MCV 86 08/20/2021   PLT 262 08/20/2021   Lab Results  Component Value Date   ALT 23 09/19/2022   AST 26 09/19/2022   ALKPHOS 141 (H) 09/19/2022   BILITOT 0.3 09/19/2022   No results found for: "25OHVITD2", "25OHVITD3", "VD25OH"   Review of Systems  Constitutional:  Negative for chills and fever.  HENT:  Negative for congestion, sinus pressure and sore throat.   Respiratory:  Negative for cough, chest tightness and shortness of breath.   Cardiovascular:  Negative for chest pain, palpitations and leg swelling.  Gastrointestinal:  Negative for abdominal distention.  Neurological:  Negative for tingling, weakness, light-headedness and numbness.    Patient Active Problem List   Diagnosis Date Noted   Familial  hypercholesterolemia 04/08/2023   Colon cancer screening    Polyp of colon    H/O total hysterectomy 08/07/2020    Allergies  Allergen Reactions   Penicillins Nausea Only    Past Surgical History:  Procedure Laterality Date   ABDOMINAL HYSTERECTOMY     partial   BREAST CYST ASPIRATION Right    neg   COLONOSCOPY WITH PROPOFOL N/A 10/30/2020   Procedure: COLONOSCOPY WITH PROPOFOL;  Surgeon: Midge Minium, MD;  Location: [facility];  Service: Endoscopy;  Laterality: N/A;  1 ML ELEVIEW INJECTED IN APPENDIX/ CECUM AREA @ 1021   POLYPECTOMY N/A 10/30/2020   Procedure: POLYPECTOMY;  Surgeon: Midge Minium, MD;  Location: [facility];  Service: Endoscopy;  Laterality: N/A;   toe spurs      Social History   Tobacco Use   Smoking status: Every Day    Types: Cigarettes   Smokeless tobacco: Never   Tobacco comments:    discussed smoking cessation- pt refuses/ has own plan  Vaping Use   Vaping status: Never Used  Substance Use Topics   Alcohol use: Yes    Comment: occasionally   Drug use: Never     Medication list has been reviewed and updated.  Current Meds  Medication Sig   amLODipine (NORVASC) 2.5 MG tablet Take 1 tablet (2.5 mg total) by mouth daily.   atorvastatin (LIPITOR) 10 MG  tablet Take 1 tablet (10 mg total) by mouth daily.   lisinopril (ZESTRIL) 10 MG tablet Take 1 tablet (10 mg total) by mouth daily. cardiology       05/01/2023    2:24 PM 04/08/2023    9:49 AM 10/04/2022    3:51 PM 09/19/2022    9:05 AM  GAD 7 : Generalized Anxiety Score  Nervous, Anxious, on Edge 0 0 0 0  Control/stop worrying 1 0 0 0  Worry too much - different things 1 1 0 0  Trouble relaxing 1 0 0 0  Restless 0 0 0 0  Easily annoyed or irritable 0 0 0 0  Afraid - awful might happen 0 0 0 0  Total GAD 7 Score 3 1 0 0  Anxiety Difficulty Not difficult at all  Not difficult at all Not difficult at all       05/01/2023    2:24 PM 04/08/2023    9:49 AM 10/04/2022    3:51 PM   Depression screen PHQ 2/9  Decreased Interest 0 0 0  Down, Depressed, Hopeless 0 0 0  PHQ - 2 Score 0 0 0  Altered sleeping 1 2 0  Tired, decreased energy 1 1 0  Change in appetite 2 0 0  Feeling bad or failure about yourself  0 0 0  Trouble concentrating 0 0 0  Moving slowly or fidgety/restless 0 0 0  Suicidal thoughts 0 0 0  PHQ-9 Score 4 3 0  Difficult doing work/chores Not difficult at all Not difficult at all Not difficult at all    BP Readings from Last 3 Encounters:  06/06/23 130/86  05/01/23 110/80  04/08/23 122/76    Physical Exam Vitals and nursing note reviewed.  Constitutional:      General: She is not in acute distress.    Appearance: She is not diaphoretic.  HENT:     Head: Normocephalic and atraumatic.     Right Ear: Tympanic membrane and external ear normal.     Left Ear: Tympanic membrane and external ear normal.     Nose: Nose normal.     Mouth/Throat:     Mouth: Mucous membranes are moist.  Eyes:     General:        Right eye: No discharge.        Left eye: No discharge.     Conjunctiva/sclera: Conjunctivae normal.     Pupils: Pupils are equal, round, and reactive to light.  Neck:     Thyroid: No thyromegaly.     Vascular: No JVD.  Cardiovascular:     Rate and Rhythm: Normal rate and regular rhythm.     Heart sounds: Normal heart sounds. No murmur heard.    No friction rub. No gallop.  Pulmonary:     Effort: Pulmonary effort is normal.     Breath sounds: Normal breath sounds. No wheezing, rhonchi or rales.  Abdominal:     General: Bowel sounds are normal.     Palpations: Abdomen is soft. There is no mass.     Tenderness: There is no abdominal tenderness. There is no guarding.  Musculoskeletal:        General: Normal range of motion.     Cervical back: Normal range of motion and neck supple.  Lymphadenopathy:     Cervical: No cervical adenopathy.  Skin:    General: Skin is warm and dry.     Findings: No rash.     Comments: Residual  hyperpigmentation of previous outbreak  Neurological:     Mental Status: She is alert.     Deep Tendon Reflexes: Reflexes are normal and symmetric.     Wt Readings from Last 3 Encounters:  06/06/23 163 lb (73.9 kg)  05/01/23 157 lb 12.8 oz (71.6 kg)  04/08/23 158 lb (71.7 kg)    BP 130/86   Pulse (!) 57   Ht 5\' 6"  (1.676 m)   Wt 163 lb (73.9 kg)   SpO2 97%   BMI 26.31 kg/m   Assessment and Plan: 1. Postherpetic neuralgia (Primary) New onset.  Persistent.  Uncontrolled.  Relatively stable.  Onset yesterday of increased pain in the area of the previous zoster outbreak in November.  Patient has noticed some sensation with now this degree of pain but this began and is at an intense level.  I do not know if this is a reinfection versus an exacerbation of the postherpetic neuralgia but we will treat with Valtrex a gram twice a day for 10 days and encourage dual action Advil with gabapentin 100 mg twice a day.  Will recheck on as-needed basis and encouraged to call back if continued level of pain on Monday.  I have also encouraged patient to use warm compresses in case this is of a musculoskeletal nature. - valACYclovir (VALTREX) 1000 MG tablet; Take 1 tablet (1,000 mg total) by mouth 2 (two) times daily for 10 days.  Dispense: 20 tablet; Refill: 0 - gabapentin (NEURONTIN) 100 MG capsule; Take 1 capsule (100 mg total) by mouth 2 (two) times daily.  Dispense: 30 capsule; Refill: 1     Elizabeth Sauer, MD

## 2023-11-24 ENCOUNTER — Ambulatory Visit (LOCAL_COMMUNITY_HEALTH_CENTER): Payer: Self-pay

## 2023-11-24 DIAGNOSIS — Z111 Encounter for screening for respiratory tuberculosis: Secondary | ICD-10-CM

## 2023-11-26 ENCOUNTER — Ambulatory Visit: Payer: Self-pay

## 2023-11-26 DIAGNOSIS — Z111 Encounter for screening for respiratory tuberculosis: Secondary | ICD-10-CM

## 2023-11-26 LAB — TB SKIN TEST
Induration: 0 mm
TB Skin Test: NEGATIVE

## 2024-02-17 ENCOUNTER — Encounter: Payer: Self-pay | Admitting: Physician Assistant

## 2024-02-17 ENCOUNTER — Ambulatory Visit: Payer: Self-pay | Admitting: Physician Assistant

## 2024-02-17 VITALS — BP 144/90 | HR 51 | Temp 97.6°F | Ht 66.0 in | Wt 156.0 lb

## 2024-02-17 DIAGNOSIS — E7800 Pure hypercholesterolemia, unspecified: Secondary | ICD-10-CM | POA: Diagnosis not present

## 2024-02-17 DIAGNOSIS — I10 Essential (primary) hypertension: Secondary | ICD-10-CM | POA: Diagnosis not present

## 2024-02-17 DIAGNOSIS — Z23 Encounter for immunization: Secondary | ICD-10-CM | POA: Diagnosis not present

## 2024-02-17 DIAGNOSIS — Z13820 Encounter for screening for osteoporosis: Secondary | ICD-10-CM

## 2024-02-17 DIAGNOSIS — Z0000 Encounter for general adult medical examination without abnormal findings: Secondary | ICD-10-CM | POA: Diagnosis not present

## 2024-02-17 DIAGNOSIS — Z1231 Encounter for screening mammogram for malignant neoplasm of breast: Secondary | ICD-10-CM

## 2024-02-17 DIAGNOSIS — Z780 Asymptomatic menopausal state: Secondary | ICD-10-CM

## 2024-02-17 DIAGNOSIS — F17200 Nicotine dependence, unspecified, uncomplicated: Secondary | ICD-10-CM | POA: Diagnosis not present

## 2024-02-17 DIAGNOSIS — Z122 Encounter for screening for malignant neoplasm of respiratory organs: Secondary | ICD-10-CM

## 2024-02-17 NOTE — Patient Instructions (Signed)

## 2024-02-17 NOTE — Progress Notes (Signed)
 Date:  02/17/2024   Name:  Jamie Cervantes   DOB:  01/23/1961   MRN:  [id number]   Chief Complaint: Transitions Of Care  HPI  Jamie Cervantes is a delightful 63 year old female with a history of HTN, tobacco use disorder, HLD presenting new to me today as a transition of care from my recently retired animator Dr. Cathryne Molt.  She is overdue for several health maintenance items, so we will be completing a routine physical today.  November 2024 she had shingles of the left leg complicated by postherpetic neuralgia for which she uses gabapentin  on occasion.  Unfortunately last night she had a flare and did not get good sleep.  She does not monitor blood pressure at home, but says she has a blood pressure cuff that her brother currently uses (he lives next-door to her).  Last Physical: 08/20/21 Last Dental Exam: Dentures Last Eye Exam: June 2025 Last CRC screen: 10/30/20, polyps, repeat 2029 Last LDCT: never Last Mammo:  2021 normal Last DEXA: Never Immunizations Due: flu, Shingrix, Prevnar 20  Medication list has been reviewed and updated.  Current Meds  Medication Sig   amLODipine  (NORVASC ) 2.5 MG tablet Take 1 tablet (2.5 mg total) by mouth daily.   atorvastatin  (LIPITOR) 10 MG tablet Take 1 tablet (10 mg total) by mouth daily.   lisinopril  (ZESTRIL ) 10 MG tablet Take 1 tablet (10 mg total) by mouth daily. cardiology     Review of Systems  Patient Active Problem List   Diagnosis Date Noted   Primary hypertension 02/17/2024   Tobacco use disorder 02/17/2024   Hypercholesterolemia 04/08/2023   Colon cancer screening    Polyp of colon    H/O total hysterectomy 08/07/2020    Allergies  Allergen Reactions   Penicillins Nausea Only    Immunization History  Administered Date(s) Administered   INFLUENZA, HIGH DOSE SEASONAL PF 02/17/2024   Influenza Inj Mdck Quad Pf 02/11/2019   Influenza, Seasonal, Injecte, Preservative Fre 04/08/2023   Influenza,inj,Quad PF,6+ Mos 02/27/2018    Influenza-Unspecified 01/21/2020   PFIZER(Purple Top)SARS-COV-2 Vaccination 07/09/2019, 07/30/2019   PNEUMOCOCCAL CONJUGATE-20 02/17/2024   PPD Test 11/24/2023    Past Surgical History:  Procedure Laterality Date   ABDOMINAL HYSTERECTOMY     partial   BREAST CYST ASPIRATION Right    neg   COLONOSCOPY WITH PROPOFOL  N/A 10/30/2020   Procedure: COLONOSCOPY WITH PROPOFOL ;  Surgeon: Jinny Carmine, MD;  Location: [facility];  Service: Endoscopy;  Laterality: N/A;  1 ML ELEVIEW INJECTED IN APPENDIX/ CECUM AREA @ 1021   POLYPECTOMY N/A 10/30/2020   Procedure: POLYPECTOMY;  Surgeon: Jinny Carmine, MD;  Location: [facility];  Service: Endoscopy;  Laterality: N/A;   toe spurs      Social History   Tobacco Use   Smoking status: Every Day    Current packs/day: 0.50    Average packs/day: 0.5 packs/day for 47.8 years (23.9 ttl pk-yrs)    Types: Cigarettes    Start date: 26   Smokeless tobacco: Never  Vaping Use   Vaping status: Never Used  Substance Use Topics   Alcohol use: Yes    Comment: occasionally   Drug use: Never    Family History  Problem Relation Age of Onset   Heart disease Mother    Hypertension Mother    Breast cancer Neg Hx         02/17/2024    8:23 AM 05/01/2023    2:24 PM 04/08/2023    9:49 AM  10/04/2022    3:51 PM  GAD 7 : Generalized Anxiety Score  Nervous, Anxious, on Edge 0 0 0 0  Control/stop worrying 1 1 0 0  Worry too much - different things 1 1 1  0  Trouble relaxing 0 1 0 0  Restless 0 0 0 0  Easily annoyed or irritable 0 0 0 0  Afraid - awful might happen 0 0 0 0  Total GAD 7 Score 2 3 1  0  Anxiety Difficulty Not difficult at all Not difficult at all  Not difficult at all       02/17/2024    8:23 AM 05/01/2023    2:24 PM 04/08/2023    9:49 AM  Depression screen PHQ 2/9  Decreased Interest 0 0 0  Down, Depressed, Hopeless 0 0 0  PHQ - 2 Score 0 0 0  Altered sleeping  1 2  Tired, decreased energy  1 1  Change in appetite   2 0  Feeling bad or failure about yourself   0 0  Trouble concentrating  0 0  Moving slowly or fidgety/restless  0 0  Suicidal thoughts  0 0  PHQ-9 Score  4 3  Difficult doing work/chores  Not difficult at all Not difficult at all    BP Readings from Last 3 Encounters:  02/17/24 (!) 144/90  06/06/23 130/86  05/01/23 110/80    Wt Readings from Last 3 Encounters:  02/17/24 156 lb (70.8 kg)  06/06/23 163 lb (73.9 kg)  05/01/23 157 lb 12.8 oz (71.6 kg)    BP (!) 144/90 (BP Location: Left Arm, Cuff Size: Normal)   Pulse (!) 51   Temp 97.6 F (36.4 C)   Ht 5' 6 (1.676 m)   Wt 156 lb (70.8 kg)   SpO2 96%   BMI 25.18 kg/m   Physical Exam Vitals and nursing note reviewed. Exam conducted with a chaperone present.  Constitutional:      Appearance: Normal appearance. She is well-groomed.  HENT:     Ears:     Comments: EAC clear bilaterally with good view of TM which is without effusion or erythema.     Nose: Nose normal.     Mouth/Throat:     Mouth: Mucous membranes are moist. No oral lesions.     Dentition: Has dentures.     Pharynx: Uvula midline. No posterior oropharyngeal erythema.  Eyes:     General: Vision grossly intact.     Extraocular Movements: Extraocular movements intact.     Conjunctiva/sclera: Conjunctivae normal.     Pupils: Pupils are equal, round, and reactive to light.  Neck:     Thyroid: No thyroid mass or thyromegaly.     Vascular: No carotid bruit.  Cardiovascular:     Rate and Rhythm: Normal rate and regular rhythm.     Heart sounds: S1 normal and S2 normal. No murmur heard.    No friction rub. No gallop.     Comments: Pulses 2+ at radial, PT, DP bilaterally. No carotid bruit. No peripheral edema Pulmonary:     Effort: Pulmonary effort is normal.     Breath sounds: Normal breath sounds.  Chest:     Comments: Breast exam typical for age without suspicious masses, asymmetry, or lymphadenopathy Abdominal:     General: Bowel sounds are normal.      Palpations: Abdomen is soft. There is no mass.     Tenderness: There is no abdominal tenderness.  Musculoskeletal:     Comments:  Full ROM with strength 5/5 bilateral upper and lower extremities  Lymphadenopathy:     Cervical: No cervical adenopathy.  Skin:    General: Skin is warm.     Capillary Refill: Capillary refill takes less than 2 seconds.     Findings: No lesion or rash.  Neurological:     Mental Status: She is alert and oriented to person, place, and time.     Cranial Nerves: Cranial nerves 2-12 are intact.     Gait: Gait is intact.  Psychiatric:        Mood and Affect: Mood and affect normal.        Behavior: Behavior normal.     Recent Labs     Component Value Date/Time   NA 143 09/19/2022 0947   K 4.9 09/19/2022 0947   CL 106 09/19/2022 0947   CO2 22 09/19/2022 0947   GLUCOSE 90 09/19/2022 0947   GLUCOSE 117 (H) 08/17/2019 0349   BUN 11 09/19/2022 0947   CREATININE 0.99 09/19/2022 0947   CALCIUM  10.0 09/19/2022 0947   PROT 7.3 09/19/2022 0947   ALBUMIN 4.6 09/19/2022 0947   AST 26 09/19/2022 0947   ALT 23 09/19/2022 0947   ALKPHOS 141 (H) 09/19/2022 0947   BILITOT 0.3 09/19/2022 0947   GFRNONAA 56 (L) 08/17/2019 0349   GFRAA >60 08/17/2019 0349    Lab Results  Component Value Date   WBC 10.0 08/20/2021   HGB 13.6 08/20/2021   HCT 42.1 08/20/2021   MCV 86 08/20/2021   PLT 262 08/20/2021   No results found for: HGBA1C Lab Results  Component Value Date   CHOL 213 (H) 09/19/2022   HDL 60 09/19/2022   LDLCALC 140 (H) 09/19/2022   TRIG 72 09/19/2022   No results found for: TSH    Assessment and Plan:  1. Annual physical exam (Primary) Encouraged healthy lifestyle including regular physical activity and consumption of whole fruits and vegetables. Encouraged routine dental and eye exams.  Due to poor sleep last night, patient will return another day for her routine labs.  - CBC with Differential/Platelet - Comprehensive metabolic panel with  GFR - TSH - Lipid panel  2. Primary hypertension Initially normotensive on CMA check, but provider recheck hypertensive in the 140s x2.  No modifications to medication at this time, but encouraged home blood pressure monitoring.  Patient to reach out if readings frequently greater than 140/90  3. Hypercholesterolemia Check lipids and adjust atorvastatin  if necessary  4. Tobacco use disorder Encouraged cessation.  Patient is in the preparation stage.  She would be interested in referral for smoking cessation program.  Also due for initial LDCT given her ongoing smoking and estimated 48 pack years. - Ambulatory Referral Lung Cancer Screening  Pulmonary - Ambulatory referral to Virtual Care Smoking Cessation  5. Screening for lung cancer Also due for initial LDCT given her ongoing smoking and estimated 48 pack years.  6. Encounter for immunization Flu and Prevnar 20 immunizations given today.  Also due for Shingrix, but this will have to be done at a local pharmacy due to Medicare status.  Patient advised she can do this at any time. - Flu vaccine HIGH DOSE PF(Fluzone Trivalent) - Pneumococcal conjugate vaccine 20-valent  7. Encounter for screening mammogram for breast cancer - MM 3D SCREENING MAMMOGRAM BILATERAL BREAST  8. Screening for osteoporosis - DG Bone Density  9. Postmenopausal - DG Bone Density     Return in about 6 months (around 08/17/2024) for OV  f/u HTN.  Sooner if blood pressure is not well-controlled at home.   Also follow-up in 1 year for CPE   Rolan Hoyle, PA-C, DMSc, Nutritionist [facility] [phone]

## 2024-02-26 ENCOUNTER — Telehealth: Payer: Self-pay | Admitting: Acute Care

## 2024-02-26 DIAGNOSIS — Z87891 Personal history of nicotine dependence: Secondary | ICD-10-CM

## 2024-02-26 DIAGNOSIS — F17210 Nicotine dependence, cigarettes, uncomplicated: Secondary | ICD-10-CM

## 2024-02-26 DIAGNOSIS — Z122 Encounter for screening for malignant neoplasm of respiratory organs: Secondary | ICD-10-CM

## 2024-02-26 NOTE — Telephone Encounter (Signed)
 Lung Cancer Screening Narrative/Criteria Questionnaire (Cigarette Smokers Only- No Cigars/Pipes/vapes)   Jamie Cervantes   SDMV:03/03/24 @1115a [place]                                           07-Dec-1960              LDCT: 03/04/24 @ 10a/[facility]    63 y.o.   Phone: [phone]  Lung Screening Narrative (confirm age 85-77 yrs Medicare / 50-80 yrs Private pay insurance)   Insurance information:[employer]   Referring Provider:[place]    This screening involves an initial phone call with a team member from our program. It is called a shared decision making visit. The initial meeting is required by insurance and Medicare to make sure you understand the program. This appointment takes about 15-20 minutes to complete. The CT scan will completed at a separate date/time. This scan takes about 5-10 minutes to complete and you may eat and drink before and after the scan.  Criteria questions for Lung Cancer Screening:   Are you a current or former smoker? Current Age began smoking: 15y   If you are a former smoker, what year did you quit smoking? N/A(within 15 yrs)   To calculate your smoking history, I need an accurate estimate of how many packs of cigarettes you smoked per day and for how many years. (Not just the number of PPD you are now smoking)   Years smoking 48 x Packs per day 1/2 ppd = Pack years 24   (at least 20 pack yrs)   (Make sure they understand that we need to know how much they have smoked in the past, not just the number of PPD they are smoking now)  Do you have a personal history of cancer?  No    Do you have a family history of cancer? No  Are you coughing up blood?  No  Have you had unexplained weight loss of 15 lbs or more in the last 6 months? No  It looks like you meet all criteria.     Additional information: N/A

## 2024-03-03 ENCOUNTER — Ambulatory Visit: Admitting: Adult Health

## 2024-03-03 ENCOUNTER — Encounter: Payer: Self-pay | Admitting: Adult Health

## 2024-03-03 DIAGNOSIS — F17210 Nicotine dependence, cigarettes, uncomplicated: Secondary | ICD-10-CM | POA: Diagnosis not present

## 2024-03-03 NOTE — Progress Notes (Signed)
  Virtual Visit via Telephone Note  I connected with JONAE RENSHAW , 03/03/24 11:15 AM by a telemedicine application and verified that I am speaking with the correct person using two identifiers.  Location: Patient: home Provider: home   I discussed the limitations of evaluation and management by telemedicine and the availability of in person appointments. The patient expressed understanding and agreed to proceed.   Shared Decision Making Visit Lung Cancer Screening Program [phone])   Eligibility: 63 y.o. Pack Years Smoking History Calculation = 24 pack years  (# packs/per year x # years smoked) Recent History of coughing up blood  no Unexplained weight loss? no ( >Than 15 pounds within the last 6 months ) Prior History Lung / other cancer no (Diagnosis within the last 5 years already requiring surveillance chest CT Scans). Smoking Status Current Smoker   Visit Components: Discussion included one or more decision making aids. YES Discussion included risk/benefits of screening. YES Discussion included potential follow up diagnostic testing for abnormal scans. YES Discussion included meaning and risk of over diagnosis. YES Discussion included meaning and risk of False Positives. YES Discussion included meaning of total radiation exposure. YES  Counseling Included: Importance of adherence to annual lung cancer LDCT screening. YES Impact of comorbidities on ability to participate in the program. YES Ability and willingness to under diagnostic treatment. YES  Smoking Cessation Counseling: Current Smokers:  Discussed importance of smoking cessation. yes Information about tobacco cessation classes and interventions provided to patient. yes Patient provided with ticket for LDCT Scan. yes Symptomatic Patient. NO Diagnosis Code: Tobacco Use Z72.0 Asymptomatic Patient yes  Counseling - 4 minutes of smoking cessation counseling  (CT Chest Lung Cancer Screening Low Dose W/O  CM) [id number]  Smoking/Tobacco Cessation Counseling ALAIAH LUNDY is a current user of tobacco or nicotine products. She is considering quitting at this time. Counseling provided today addressed the risks of continued use and the benefits of cessation. Discussed tobacco/nicotine use history, readiness to quit, and evidence-based treatment options including behavioral strategies, support resources, and pharmacologic therapies. Provided encouragement and educational materials on steps and resources to quit smoking. Patient questions were addressed, and follow-up recommended for continued support. Total time spent on counseling: 4 minutes.    Z12.2-Screening of respiratory organs Z87.891-Personal history of nicotine dependence   Lamarr Myers 03/03/24

## 2024-03-03 NOTE — Patient Instructions (Signed)

## 2024-03-04 ENCOUNTER — Ambulatory Visit
Admission: RE | Admit: 2024-03-04 | Discharge: 2024-03-04 | Disposition: A | Discharge status: Home / Self Care | Source: Ambulatory Visit | Attending: Acute Care | Admitting: Acute Care

## 2024-03-04 DIAGNOSIS — F17210 Nicotine dependence, cigarettes, uncomplicated: Secondary | ICD-10-CM | POA: Diagnosis present

## 2024-03-04 DIAGNOSIS — Z122 Encounter for screening for malignant neoplasm of respiratory organs: Secondary | ICD-10-CM | POA: Diagnosis present

## 2024-03-04 DIAGNOSIS — Z87891 Personal history of nicotine dependence: Secondary | ICD-10-CM | POA: Insufficient documentation

## 2024-03-12 ENCOUNTER — Telehealth: Payer: Self-pay | Admitting: Physician Assistant

## 2024-03-12 ENCOUNTER — Telehealth: Payer: Self-pay | Admitting: Acute Care

## 2024-03-12 NOTE — Telephone Encounter (Signed)
 Spoke with patient regarding recent LDCT result and need to schedule mammogram as ordered at our recent physical. She was given the number for [facility] center and advised to call and schedule her DEXA and mammogram today. She says she will call now.

## 2024-03-12 NOTE — Telephone Encounter (Signed)
 Please call patient let her know her low-dose screening CT was read as a lung RADS 1, negative scan.  Follow-up will be her annual scan in November 2026. Please place order for annual low-dose CT.  Please let her know there was notation of a 1.3 cm left axillary lymph node noted on the scan.  Radiology wanted correlation with screening mammography.  Looking back on patient's imaging she has not had a mammogram since 2021.  Patient's PCP is listed on this telephone note.  However I would appreciate it if we could also call his office to let them know the finding and to make sure that they are aware that she needs a mammogram.  Please have the patient follow-up with her primary care physician, Toribio Hoyle, PA, and discuss follow-up mammogram for further evaluation of that finding.  Daniel, I have included you on this telephone note to make you aware of the left axillary lymph node finding.  Please follow-up as you feel is clinically appropriate in regard to having patient get her annual mammogram.  Thank you so much

## 2024-03-15 ENCOUNTER — Other Ambulatory Visit: Payer: Self-pay

## 2024-03-15 DIAGNOSIS — F17210 Nicotine dependence, cigarettes, uncomplicated: Secondary | ICD-10-CM

## 2024-03-15 DIAGNOSIS — Z87891 Personal history of nicotine dependence: Secondary | ICD-10-CM

## 2024-03-15 DIAGNOSIS — Z122 Encounter for screening for malignant neoplasm of respiratory organs: Secondary | ICD-10-CM

## 2024-03-25 ENCOUNTER — Encounter: Admitting: Student

## 2024-04-27 ENCOUNTER — Ambulatory Visit
Admission: RE | Admit: 2024-04-27 | Discharge: 2024-04-27 | Disposition: A | Discharge status: Home / Self Care | Source: Ambulatory Visit | Attending: Physician Assistant | Admitting: Physician Assistant

## 2024-04-27 ENCOUNTER — Ambulatory Visit
Admission: RE | Admit: 2024-04-27 | Discharge: 2024-04-27 | Disposition: A | Source: Ambulatory Visit | Attending: Physician Assistant | Admitting: Physician Assistant

## 2024-04-27 DIAGNOSIS — Z780 Asymptomatic menopausal state: Secondary | ICD-10-CM | POA: Insufficient documentation

## 2024-04-27 DIAGNOSIS — Z1231 Encounter for screening mammogram for malignant neoplasm of breast: Secondary | ICD-10-CM | POA: Diagnosis present

## 2024-04-27 DIAGNOSIS — Z13820 Encounter for screening for osteoporosis: Secondary | ICD-10-CM | POA: Insufficient documentation

## 2024-04-29 ENCOUNTER — Ambulatory Visit: Payer: Self-pay | Admitting: Physician Assistant

## 2024-04-29 DIAGNOSIS — Z780 Asymptomatic menopausal state: Secondary | ICD-10-CM | POA: Insufficient documentation

## 2024-04-30 ENCOUNTER — Ambulatory Visit: Payer: Self-pay

## 2024-04-30 NOTE — Telephone Encounter (Signed)
 Noted

## 2024-04-30 NOTE — Telephone Encounter (Signed)
 FYI Only or Action Required?: Action required by provider: update on patient condition.  Patient was last seen in primary care on 02/17/2024 by Manya Toribio SQUIBB, PA.  Called Nurse Triage reporting Results.   Triage Disposition: Call PCP When Office is Open  Patient/caregiver understands and will follow disposition?: Yes     Copied from CRM #[id number]. Topic: Clinical - Lab/Test Results >> Apr 30, 2024  4:30 PM Antwanette L wrote: Reason for CRM: Pt is returning a missed call about results.   Reason for Disposition  [1] Caller requesting NON-URGENT health information AND [2] PCP's office is the best resource  Answer Assessment - Initial Assessment Questions 1. REASON FOR CALL: What is the main reason for your call? or How can I best help you?    Pt returned call regarding results. Per my chart, relayed info below.      Manya Toribio SQUIBB, PA to [facility]    04/29/24 11:53 AM Result Note Please inform Vana that her bone density scan shows mildly low bone density in several places, which is a condition we call osteopenia. Because this increases risk of bone fractures, I encourage good supplementation of calcium  and vitamin D either through an OTC supplement or women's multivitamin. The goal for TOTAL daily intake (from supplements and from food) is about 1200 mg of calcium  and 800 IU of Vit D daily. I also recommend routine weight-bearing exercise to promote bone health, at least 3x per week for 20 min sessions. This could be something as simple as walking around the neighborhood, but may also include things like yoga, dance, mat exercises, etc. Quitting smoking would also help this problem.   Her mammogram was normal and we will plan to repeat in 1 year.   DG Bone Density   Pt verbalized understanding. Would like to schedule back with PCP to discuss feminine hygiene. PT states she drinks a lot of soda and teas which cause vaginal/urine odor. Discussed water  intake and AZO  for urinary health, pt declined recs. Appointment scheduled for evaluation. Patient agrees with plan of care, and will call back if anything changes, or if symptoms worsen.  Protocols used: Information Only Call - No Triage-A-AH

## 2024-05-03 ENCOUNTER — Encounter: Payer: Self-pay | Admitting: Physician Assistant

## 2024-05-03 ENCOUNTER — Ambulatory Visit: Admitting: Physician Assistant

## 2024-05-03 VITALS — BP 156/78 | HR 61 | Temp 98.0°F | Ht 66.0 in | Wt 150.0 lb

## 2024-05-03 DIAGNOSIS — N3001 Acute cystitis with hematuria: Secondary | ICD-10-CM | POA: Diagnosis not present

## 2024-05-03 DIAGNOSIS — G4709 Other insomnia: Secondary | ICD-10-CM | POA: Diagnosis not present

## 2024-05-03 DIAGNOSIS — R350 Frequency of micturition: Secondary | ICD-10-CM

## 2024-05-03 DIAGNOSIS — E7800 Pure hypercholesterolemia, unspecified: Secondary | ICD-10-CM

## 2024-05-03 DIAGNOSIS — B0229 Other postherpetic nervous system involvement: Secondary | ICD-10-CM | POA: Insufficient documentation

## 2024-05-03 LAB — POCT URINALYSIS DIPSTICK
Bilirubin, UA: NEGATIVE
Glucose, UA: NEGATIVE
Ketones, UA: NEGATIVE
Nitrite, UA: POSITIVE
Protein, UA: NEGATIVE
Spec Grav, UA: 1.015
Urobilinogen, UA: 0.2 U/dL
pH, UA: 6

## 2024-05-03 MED ORDER — NITROFURANTOIN MONOHYD MACRO 100 MG PO CAPS: 100.0000 mg | ORAL_CAPSULE | Freq: Two times a day (BID) | ORAL | 0 refills | Status: AC

## 2024-05-03 MED ORDER — ATORVASTATIN CALCIUM 10 MG PO TABS: 10.0000 mg | ORAL_TABLET | Freq: Every day | ORAL | 2 refills | Status: AC

## 2024-05-03 MED ORDER — TRAZODONE HCL 50 MG PO TABS: 25.0000 mg | ORAL_TABLET | Freq: Every evening | ORAL | 1 refills | Status: DC | PRN

## 2024-05-03 MED ORDER — GABAPENTIN 100 MG PO CAPS: 100.0000 mg | ORAL_CAPSULE | Freq: Two times a day (BID) | ORAL | 1 refills | Status: AC | PRN

## 2024-05-03 NOTE — Progress Notes (Signed)
 "   Date:  05/03/2024   Name:  Jamie Cervantes   DOB:  June 09, 1960   MRN:  [id number]   Chief Complaint: Urinary Frequency (XOdor urine, legs and feet tingle when using the bathroom, getting worse )  Urinary Frequency  The problem occurs every urination. The problem has been gradually worsening. The pain is at a severity of 0/10. The patient is experiencing no pain. There has been no fever. She is Sexually active. There is No history of pyelonephritis. Associated symptoms include frequency and urgency. She has tried nothing for the symptoms.    Ladonne presents today for evaluation of urinary frequency, urgency, and malodorous urine.  She is also requesting refill on gabapentin  which she uses as needed for her paresthesias.  Also endorses significant sleep difficulty, reporting fragmented sleep in chunks of 2 hours at a time  Medication list has been reviewed and updated.  Active Medications[1]   Review of Systems  Genitourinary:  Positive for frequency and urgency.    Patient Active Problem List   Diagnosis Date Noted   Osteopenia after menopause 04/29/2024   Primary hypertension 02/17/2024   Tobacco use disorder 02/17/2024   Hypercholesterolemia 04/08/2023   Colon cancer screening    Polyp of colon    H/O total hysterectomy 08/07/2020    Allergies[2]  Immunization History  Administered Date(s) Administered   INFLUENZA, HIGH DOSE SEASONAL PF 02/17/2024   Influenza Inj Mdck Quad Pf 02/11/2019   Influenza, Seasonal, Injecte, Preservative Fre 04/08/2023   Influenza,inj,Quad PF,6+ Mos 02/27/2018   Influenza-Unspecified 01/21/2020   PFIZER(Purple Top)SARS-COV-2 Vaccination 07/09/2019, 07/30/2019   PNEUMOCOCCAL CONJUGATE-20 02/17/2024   PPD Test 11/24/2023    Past Surgical History:  Procedure Laterality Date   ABDOMINAL HYSTERECTOMY     partial   BREAST CYST ASPIRATION Right    neg   COLONOSCOPY WITH PROPOFOL  N/A 10/30/2020   Procedure: COLONOSCOPY WITH PROPOFOL ;   Surgeon: Jinny Carmine, MD;  [facility];  Service: Endoscopy;  Laterality: N/A;  1 ML ELEVIEW INJECTED IN APPENDIX/ CECUM AREA @ 1021   POLYPECTOMY N/A 10/30/2020   Procedure: POLYPECTOMY;  Surgeon: Jinny Carmine, MD;  Location: [facility];  Service: Endoscopy;  Laterality: N/A;   toe spurs      Social History[3]  Family History  Problem Relation Age of Onset   Heart disease Mother    Hypertension Mother    Breast cancer Neg Hx         05/03/2024    4:19 PM 02/17/2024    8:23 AM 05/01/2023    2:24 PM 04/08/2023    9:49 AM  GAD 7 : Generalized Anxiety Score  Nervous, Anxious, on Edge 0 0 0 0  Control/stop worrying 0 1 1 0  Worry too much - different things 0 1 1 1   Trouble relaxing 0 0 1 0  Restless 0 0 0 0  Easily annoyed or irritable 0 0 0 0  Afraid - awful might happen 0 0 0 0  Total GAD 7 Score 0 2 3 1   Anxiety Difficulty Not difficult at all Not difficult at all Not difficult at all        05/03/2024    4:19 PM 02/17/2024    8:23 AM 05/01/2023    2:24 PM  Depression screen PHQ 2/9  Decreased Interest 0 0 0  Down, Depressed, Hopeless 0 0 0  PHQ - 2 Score 0 0 0  Altered sleeping   1  Tired, decreased energy  1  Change in appetite   2  Feeling bad or failure about yourself    0  Trouble concentrating   0  Moving slowly or fidgety/restless   0  Suicidal thoughts   0  PHQ-9 Score   4   Difficult doing work/chores   Not difficult at all     Data saved with a previous flowsheet row definition    BP Readings from Last 3 Encounters:  05/03/24 (!) 156/78  02/17/24 (!) 144/90  06/06/23 130/86    Wt Readings from Last 3 Encounters:  05/03/24 150 lb (68 kg)  02/17/24 156 lb (70.8 kg)  06/06/23 163 lb (73.9 kg)    BP (!) 156/78   Pulse 61   Temp 98 F (36.7 C)   Ht 5' 6 (1.676 m)   Wt 150 lb (68 kg)   SpO2 97%   BMI 24.21 kg/m   Physical Exam Vitals and nursing note reviewed.  Constitutional:      Appearance: Normal  appearance.  Cardiovascular:     Rate and Rhythm: Normal rate.  Pulmonary:     Effort: Pulmonary effort is normal.  Abdominal:     General: There is no distension.  Musculoskeletal:        General: Normal range of motion.  Skin:    General: Skin is warm and dry.  Neurological:     Mental Status: She is alert and oriented to person, place, and time.     Gait: Gait is intact.  Psychiatric:        Mood and Affect: Mood and affect normal.    Lab Results  Component Value Date   COLORU yellow 05/03/2024   CLARITYU cloudy 05/03/2024   GLUCOSEUR Negative 05/03/2024   BILIRUBINUR neg 05/03/2024   KETONESU neg 05/03/2024   SPECGRAV 1.015 05/03/2024   RBCUR moder (A) 05/03/2024   PHUR 6.0 05/03/2024   PROTEINUR Negative 05/03/2024   UROBILINOGEN 0.2 05/03/2024   LEUKOCYTESUR Small (1+) (A) 05/03/2024     Recent Labs     Component Value Date/Time   NA 143 09/19/2022 0947   K 4.9 09/19/2022 0947   CL 106 09/19/2022 0947   CO2 22 09/19/2022 0947   GLUCOSE 90 09/19/2022 0947   GLUCOSE 117 (H) 08/17/2019 0349   BUN 11 09/19/2022 0947   CREATININE 0.99 09/19/2022 0947   CALCIUM  10.0 09/19/2022 0947   PROT 7.3 09/19/2022 0947   ALBUMIN 4.6 09/19/2022 0947   AST 26 09/19/2022 0947   ALT 23 09/19/2022 0947   ALKPHOS 141 (H) 09/19/2022 0947   BILITOT 0.3 09/19/2022 0947   GFRNONAA 56 (L) 08/17/2019 0349   GFRAA >60 08/17/2019 0349    Lab Results  Component Value Date   WBC 10.0 08/20/2021   HGB 13.6 08/20/2021   HCT 42.1 08/20/2021   MCV 86 08/20/2021   PLT 262 08/20/2021   No results found for: HGBA1C Lab Results  Component Value Date   CHOL 213 (H) 09/19/2022   HDL 60 09/19/2022   LDLCALC 140 (H) 09/19/2022   TRIG 72 09/19/2022   No results found for: TSH    Assessment and Plan:  1. Acute cystitis with hematuria (Primary) Begin treatment with nitrofurantoin  and send for culture - nitrofurantoin , macrocrystal-monohydrate, (MACROBID ) 100 MG capsule; Take  1 capsule (100 mg total) by mouth 2 (two) times daily for 5 days.  Dispense: 10 capsule; Refill: 0  2. Urinary frequency Plan as above, consider also checking A1c next labs.  She was supposed to have routine lab work in October but never did - POCT Urinalysis Dipstick - Urine Culture  3. Other insomnia Try trazodone  as below - traZODone  (DESYREL ) 50 MG tablet; Take 0.5-1 tablets (25-50 mg total) by mouth at bedtime as needed for sleep.  Dispense: 30 tablet; Refill: 1  4. Postherpetic neuralgia - gabapentin  (NEURONTIN ) 100 MG capsule; Take 1 capsule (100 mg total) by mouth 2 (two) times daily as needed.  Dispense: 90 capsule; Refill: 1  5. Pure hypercholesterolemia - atorvastatin  (LIPITOR) 10 MG tablet; Take 1 tablet (10 mg total) by mouth daily.  Dispense: 90 tablet; Refill: 2     Short interval follow-up 3 weeks for sleep, HTN   Rolan Hoyle, PA-C, DMSc, DipACLM, Nutritionist [place] [facility] [phone]      [1]  Current Meds  Medication Sig   amLODipine  (NORVASC ) 2.5 MG tablet Take 1 tablet (2.5 mg total) by mouth daily.   lisinopril  (ZESTRIL ) 10 MG tablet Take 1 tablet (10 mg total) by mouth daily. cardiology   nitrofurantoin , macrocrystal-monohydrate, (MACROBID ) 100 MG capsule Take 1 capsule (100 mg total) by mouth 2 (two) times daily for 5 days.   traZODone  (DESYREL ) 50 MG tablet Take 0.5-1 tablets (25-50 mg total) by mouth at bedtime as needed for sleep.   [DISCONTINUED] atorvastatin  (LIPITOR) 10 MG tablet Take 1 tablet (10 mg total) by mouth daily.  [2]  Allergies Allergen Reactions   Penicillins Nausea Only  [3]  Social History Tobacco Use   Smoking status: Every Day    Current packs/day: 0.50    Average packs/day: 0.5 packs/day for 48.0 years (24.0 ttl pk-yrs)    Types: Cigarettes    Start date: 1978   Smokeless tobacco: Never  Vaping Use   Vaping status: Never Used  Substance Use  Topics   Alcohol use: Yes    Comment: occasionally   Drug use: Never   "

## 2024-05-06 ENCOUNTER — Ambulatory Visit: Payer: Self-pay | Admitting: Physician Assistant

## 2024-05-06 LAB — URINE CULTURE

## 2024-05-24 ENCOUNTER — Ambulatory Visit: Admitting: Physician Assistant

## 2024-05-25 ENCOUNTER — Other Ambulatory Visit: Payer: Self-pay | Admitting: Physician Assistant

## 2024-05-25 DIAGNOSIS — G4709 Other insomnia: Secondary | ICD-10-CM

## 2024-05-27 NOTE — Telephone Encounter (Signed)
 Requested medication (s) are due for refill today: yes  Requested medication (s) are on the active medication list: yes  Last refill:  05/03/24 #30 1 refills  Future visit scheduled: yes 06/10/24  Notes to clinic:  Pharmacy comment: REQUEST FOR 90 DAYS PRESCRIPTION. DX Code Needed      Requested Prescriptions  Pending Prescriptions Disp Refills   traZODone  (DESYREL ) 50 MG tablet [Pharmacy Med Name: TRAZODONE  50 MG TABLET] 90 tablet 1    Sig: TAKE 0.5-1 TABLETS BY MOUTH AT BEDTIME AS NEEDED FOR SLEEP.     Psychiatry: Antidepressants - Serotonin Modulator Passed - 05/27/2024  7:38 AM      Passed - Valid encounter within last 6 months    Recent Outpatient Visits           3 weeks ago Acute cystitis with hematuria   [facility] at [facility], Toribio SQUIBB, [place]   3 months ago Annual physical exam   [facility] at [facility], [place]io SQUIBB, [place]   11 months ago Postherpetic neuralgia   [facility] Cathryne JAYSON, [place]

## 2024-06-03 ENCOUNTER — Ambulatory Visit: Admitting: Physician Assistant

## 2024-06-10 ENCOUNTER — Ambulatory Visit: Admitting: Physician Assistant

## 2024-08-17 ENCOUNTER — Ambulatory Visit: Admitting: Physician Assistant

## 2025-02-17 ENCOUNTER — Encounter: Admitting: Physician Assistant
